# Patient Record
Sex: Female | Born: 1935 | Race: White | Hispanic: No | Marital: Single | Smoking: Never smoker
Health system: Southern US, Community
[De-identification: ages and names within clinical notes are randomized; demographics above are authoritative.]

## PROBLEM LIST (undated history)

## (undated) DIAGNOSIS — C569 Malignant neoplasm of unspecified ovary: Secondary | ICD-10-CM

## (undated) DIAGNOSIS — E785 Hyperlipidemia, unspecified: Secondary | ICD-10-CM

## (undated) DIAGNOSIS — F0390 Unspecified dementia without behavioral disturbance: Secondary | ICD-10-CM

## (undated) DIAGNOSIS — C801 Malignant (primary) neoplasm, unspecified: Secondary | ICD-10-CM

## (undated) DIAGNOSIS — H3530 Unspecified macular degeneration: Secondary | ICD-10-CM

## (undated) DIAGNOSIS — I10 Essential (primary) hypertension: Secondary | ICD-10-CM

---

## 1998-02-04 ENCOUNTER — Other Ambulatory Visit: Admission: RE | Admit: 1998-02-04 | Discharge: 1998-02-04 | Payer: Self-pay | Admitting: Obstetrics and Gynecology

## 1999-02-18 ENCOUNTER — Other Ambulatory Visit: Admission: RE | Admit: 1999-02-18 | Discharge: 1999-02-18 | Payer: Self-pay | Admitting: Obstetrics and Gynecology

## 1999-08-18 ENCOUNTER — Emergency Department (HOSPITAL_COMMUNITY): Admission: EM | Admit: 1999-08-18 | Discharge: 1999-08-18 | Payer: Self-pay | Admitting: *Deleted

## 2000-03-06 ENCOUNTER — Other Ambulatory Visit: Admission: RE | Admit: 2000-03-06 | Discharge: 2000-03-06 | Payer: Self-pay | Admitting: Obstetrics and Gynecology

## 2001-05-16 ENCOUNTER — Other Ambulatory Visit: Admission: RE | Admit: 2001-05-16 | Discharge: 2001-05-16 | Payer: Self-pay | Admitting: Obstetrics and Gynecology

## 2002-06-20 ENCOUNTER — Other Ambulatory Visit: Admission: RE | Admit: 2002-06-20 | Discharge: 2002-06-20 | Payer: Self-pay | Admitting: Obstetrics and Gynecology

## 2002-11-12 ENCOUNTER — Encounter (INDEPENDENT_AMBULATORY_CARE_PROVIDER_SITE_OTHER): Payer: Self-pay | Admitting: Specialist

## 2002-11-12 ENCOUNTER — Ambulatory Visit (HOSPITAL_COMMUNITY): Admission: RE | Admit: 2002-11-12 | Discharge: 2002-11-12 | Payer: Self-pay | Admitting: Gastroenterology

## 2003-07-07 ENCOUNTER — Other Ambulatory Visit: Admission: RE | Admit: 2003-07-07 | Discharge: 2003-07-07 | Payer: Self-pay | Admitting: Obstetrics and Gynecology

## 2004-11-12 ENCOUNTER — Other Ambulatory Visit: Admission: RE | Admit: 2004-11-12 | Discharge: 2004-11-12 | Payer: Self-pay | Admitting: Obstetrics and Gynecology

## 2005-07-11 ENCOUNTER — Other Ambulatory Visit: Admission: RE | Admit: 2005-07-11 | Discharge: 2005-07-11 | Payer: Self-pay | Admitting: Obstetrics and Gynecology

## 2005-12-27 ENCOUNTER — Other Ambulatory Visit: Admission: RE | Admit: 2005-12-27 | Discharge: 2005-12-27 | Payer: Self-pay | Admitting: Family Medicine

## 2007-11-25 ENCOUNTER — Emergency Department (HOSPITAL_COMMUNITY): Admission: EM | Admit: 2007-11-25 | Discharge: 2007-11-25 | Payer: Self-pay | Admitting: Emergency Medicine

## 2009-12-04 ENCOUNTER — Observation Stay (HOSPITAL_COMMUNITY): Admission: EM | Admit: 2009-12-04 | Discharge: 2009-12-05 | Payer: Self-pay | Admitting: Emergency Medicine

## 2010-01-14 ENCOUNTER — Encounter (INDEPENDENT_AMBULATORY_CARE_PROVIDER_SITE_OTHER): Payer: Self-pay | Admitting: *Deleted

## 2010-02-04 ENCOUNTER — Ambulatory Visit: Payer: Self-pay | Admitting: Cardiology

## 2010-02-04 DIAGNOSIS — R079 Chest pain, unspecified: Secondary | ICD-10-CM | POA: Insufficient documentation

## 2010-02-25 ENCOUNTER — Ambulatory Visit: Payer: Self-pay

## 2010-02-25 ENCOUNTER — Encounter: Payer: Self-pay | Admitting: Cardiology

## 2010-02-25 ENCOUNTER — Ambulatory Visit (HOSPITAL_COMMUNITY): Admission: RE | Admit: 2010-02-25 | Discharge: 2010-02-25 | Payer: Self-pay | Admitting: Cardiology

## 2010-02-25 ENCOUNTER — Ambulatory Visit: Payer: Self-pay | Admitting: Internal Medicine

## 2010-11-04 NOTE — Letter (Signed)
Summary: Appointment - Reschedule  [employer], Main Office  [address]   [place], [place] [address]   Phone: [phone]  Fax: [phone]     January 14, 2010 MRN: [id number]   [name] [address] [facility] [address] [place], [place]  [address]   Dear Ms. Brandl,   Due to a change in our office schedule, your appointment on 02/05/2010 at 3:00pm must be changed.  It is very important that we reach you to reschedule this appointment. We look forward to participating in your health care needs. Please contact us at the number listed above at your earliest convenience to reschedule this appointment.     Sincerely,  [facility] Scheduling Team

## 2010-11-04 NOTE — Assessment & Plan Note (Signed)
Summary: nph/chest pain/pt seen in hospital/jml   Primary Provider:  Dr. Foy Guadalajara   History of Present Illness: 77 yo with history of dementia, HTN and hyperlipidemia presents for followup of recent hospitalization for chest pain.  Patient was admitted with aching pain across her upper chest that occurred after breakfast one morning in 3/11.  She had just returned to her room from the dining room.  The pain lasted about 30 minutes and resolved spontaneously.  She went to the ER.  Cardiac enzymes were negative x 3 in the hospital and ECG was unremarkable.  A CXR showed no significant disease.  Since that time, she has had no further chest pain.  She is able to walk around the assisted living without problems, no significant shortness of breath.  No palpitations or lightheadedness.    ECG: NSR, left atrial enlargement  Labs (3/11): LDL 77, HDL 42, cardiac enzymes negative x 3  Current Medications (verified): 1)  I-Vite  Tabs (Multiple Vitamins-Minerals) .... Take One Tablet Once Daily 2)  Lisinopril-Hydrochlorothiazide 10-12.5 Mg Tabs (Lisinopril-Hydrochlorothiazide) .... Take One Tablet Once Daily 3)  Vitamin D 400 Unit Tabs (Cholecalciferol) .... Once Daily 4)  Oscal 500/200 D-3 500-200 Mg-Unit Tabs (Calcium-Vitamin D) .... Once Daily 5)  Aspirin 81 Mg Tabs (Aspirin) .... Once Daily 6)  Namenda 10 Mg Tabs (Memantine Hcl) .... Take One Tablet Two Times A Day 7)  Simvastatin 40 Mg Tabs (Simvastatin) .... Take One Tablet Once Daily  Allergies (verified): No Known Drug Allergies  Past History:  Past Medical History: 1. Dementia 2. HTN 3. Hyperlipidemia 4. TAH 5. Chest pain  Family History: Grandmother with several MIs. Father with possible MI.   Social History: Lives in [facility] assisted living in [place].  Has an adopted daughter and son.  Nonsmoker, no ETOH.   Review of Systems       All systems reviewed and negative except as per HPI.   Vital Signs:  Patient profile:    77 year old female Height:      64 inches Weight:      157 pounds BMI:     27.05 Pulse rate:   77 / minute Pulse rhythm:   regular BP sitting:   116 / 72  (left arm) Cuff size:   regular  Vitals Entered By: Judithe Modest CMA (Feb 04, 2010 11:06 AM)  Physical Exam  General:  Well developed, well nourished, in no acute distress. Head:  normocephalic and atraumatic Nose:  no deformity, discharge, inflammation, or lesions Mouth:  Teeth, gums and palate normal. Oral mucosa normal. Neck:  Neck supple, no JVD. No masses, thyromegaly or abnormal cervical nodes. Lungs:  Clear bilaterally to auscultation and percussion. Heart:  Non-displaced PMI, chest non-tender; regular rate and rhythm, S1, S2 without murmurs, rubs or gallops. Carotid upstroke normal, no bruit.  Trace PT pulse on right, 2+ PT pulse on left.  No edema, no varicosities. Abdomen:  Bowel sounds positive; abdomen soft and non-tender without masses, organomegaly, or hernias noted. No hepatosplenomegaly. Msk:  Back normal, normal gait. Muscle strength and tone normal. Extremities:  No clubbing or cyanosis. Neurologic:  Alert, oriented.  Able to give history.   Skin:  Intact without lesions or rashes. Psych:  Normal affect.   Impression & Recommendations:  Problem # 1:  CHEST PAIN UNSPECIFIED (ICD-786.50) Patient had an episode of chest pain in March of this year after eating breakfast.  No chest pain before that or since.  She seems to have  good exercise tolerance.  ECG is essentially normal.  I think that this would best be managed conservatively.  She is already on a good medical regimen with ASA, statin, and ACEI.  BP is controlled.  Lipids were excellent in the hospital in 3/11.  I will hold off on stress test and will have her get an echocardiogram to make sure that the LV function is preserved. No further workup necessary at this time.   Other Orders: Echocardiogram (Echo)  Patient Instructions: 1)  Your physician has  requested that you have an echocardiogram.  Echocardiography is a painless test that uses sound waves to create images of your heart. It provides your doctor with information about the size and shape of your heart and how well your heart's chambers and valves are working.  This procedure takes approximately one hour. There are no restrictions for this procedure. 2)  Your physician recommends that you schedule a follow-up appointment as needed with Dr Shirlee Latch  if testing is normal.

## 2010-11-04 NOTE — Letter (Signed)
Summary: Dam[facility]   Imported By: Marylou Mccoy 03/25/2010 12:29:05  _____________________________________________________________________  External Attachment:    Type:   Image     Comment:   External Document

## 2010-12-26 LAB — DIFFERENTIAL
Basophils Relative: 1 % (ref 0–1)
Eosinophils Absolute: 0 10*3/uL (ref 0.0–0.7)
Monocytes Relative: 7 % (ref 3–12)
Neutrophils Relative %: 71 % (ref 43–77)

## 2010-12-26 LAB — POCT I-STAT, CHEM 8
BUN: 10 mg/dL (ref 6–23)
Chloride: 105 mEq/L (ref 96–112)
Creatinine, Ser: 0.6 mg/dL (ref 0.4–1.2)
Glucose, Bld: 111 mg/dL — ABNORMAL HIGH (ref 70–99)
Potassium: 3.7 mEq/L (ref 3.5–5.1)

## 2010-12-26 LAB — CBC
Hemoglobin: 12.6 g/dL (ref 12.0–15.0)
MCHC: 34.6 g/dL (ref 30.0–36.0)
MCV: 87.4 fL (ref 78.0–100.0)
RBC: 4.12 MIL/uL (ref 3.87–5.11)
RBC: 4.49 MIL/uL (ref 3.87–5.11)
RDW: 13.5 % (ref 11.5–15.5)

## 2010-12-26 LAB — BASIC METABOLIC PANEL
BUN: 9 mg/dL (ref 6–23)
CO2: 29 mEq/L (ref 19–32)
Calcium: 9.2 mg/dL (ref 8.4–10.5)
Calcium: 9.7 mg/dL (ref 8.4–10.5)
Chloride: 101 mEq/L (ref 96–112)
Creatinine, Ser: 0.72 mg/dL (ref 0.4–1.2)
Creatinine, Ser: 0.74 mg/dL (ref 0.4–1.2)
GFR calc Af Amer: >60 mL/min (ref >60)
GFR calc Af Amer: >60 mL/min (ref >60)
GFR calc non Af Amer: >60 mL/min (ref >60)
GFR calc non Af Amer: >60 mL/min (ref >60)
Glucose, Bld: 120 mg/dL — ABNORMAL HIGH (ref 70–99)
Potassium: 3.7 mEq/L (ref 3.5–5.1)
Sodium: 139 mEq/L (ref 135–145)
Sodium: 140 mEq/L (ref 135–145)

## 2010-12-26 LAB — HEMOGLOBIN A1C
Hgb A1c MFr Bld: 6.5 % — ABNORMAL HIGH (ref 4.6–6.1)
Mean Plasma Glucose: 140 mg/dL

## 2010-12-26 LAB — CK TOTAL AND CKMB (NOT AT ARMC)
CK, MB: 1.1 ng/mL (ref 0.3–4.0)
CK, MB: 1.4 ng/mL (ref 0.3–4.0)
Relative Index: INVALID (ref 0.0–2.5)
Relative Index: INVALID (ref 0.0–2.5)
Total CK: 75 U/L (ref 7–177)

## 2010-12-26 LAB — TROPONIN I: Troponin I: 0.03 ng/mL (ref 0.00–0.06)

## 2010-12-26 LAB — LIPID PANEL
HDL: 42 mg/dL (ref >39)
Total CHOL/HDL Ratio: 3.5 RATIO

## 2010-12-26 LAB — POCT CARDIAC MARKERS: Myoglobin, poc: 72.4 ng/mL (ref 12–200)

## 2011-02-18 NOTE — Op Note (Signed)
   NAME:  [name], [name]                     ACCOUNT NO.:  [id number]   MEDICAL RECORD NO.:  [id number]                   PATIENT TYPE:  AMB   LOCATION:  ENDO                                 FACILITY:  [facility]   PHYSICIAN:  John C. Madilyn Fireman, M.D.                 DATE OF BIRTH:  12-22-1925   DATE OF PROCEDURE:  11/12/2002  DATE OF DISCHARGE:                                 OPERATIVE REPORT   PROCEDURE:  Colonoscopy with polypectomy.   INDICATION FOR PROCEDURE:  History of ovarian cancer in a 77 year old  patient with no prior colon screening.   DESCRIPTION OF PROCEDURE:  The patient was placed in the left lateral  decubitus position and placed on the pulse monitor with continuous low-flow  oxygen delivered by nasal cannula.  She was sedated with 75 mcg IV fentanyl  and 5 mg IV Versed.  The Olympus video colonoscope was inserted into the  rectum and advanced to the cecum, confirmed by transillumination at  McBurney's point and visualization of the ileocecal valve and appendiceal  orifice.  The prep was excellent.  The cecum and ascending colon appeared  normal with no masses, polyps, diverticula, or other mucosal abnormalities.  Within the transverse colon, there was a 6 mm sessile polyp which was  fulgurated by hot biopsy.  The descending colon appeared normal.  The  sigmoid colon revealed a 1.25 cm sessile polyp which was removed by snare  and the base touched up with the hot biopsy forceps.  The remainder of the  sigmoid and rectum appeared normal.  The scope was then withdrawn, and the  patient returned to the recovery room in stable condition.  She tolerated  the procedure well, and there were no immediate complications.   IMPRESSION:  Sigmoid and transverse colon polyps.   PLAN:  Await histology to determine method and interval for future colon  screening.                                               John C. Madilyn Fireman, M.D.    JCH/MEDQ  D:  11/12/2002  T:  11/12/2002  Job:   [id number]   cc:   Molly Maduro L. Foy Guadalajara, M.D.  [address] [place]  [place] [address]  Fax: [phone]

## 2011-04-10 ENCOUNTER — Emergency Department (HOSPITAL_COMMUNITY): Payer: Medicare Other

## 2011-04-10 ENCOUNTER — Observation Stay (HOSPITAL_COMMUNITY)
Admission: EM | Admit: 2011-04-10 | Discharge: 2011-04-11 | Disposition: A | Discharge status: Home / Self Care | Payer: Medicare Other | Attending: Emergency Medicine | Admitting: Emergency Medicine

## 2011-04-10 DIAGNOSIS — Z79899 Other long term (current) drug therapy: Secondary | ICD-10-CM | POA: Insufficient documentation

## 2011-04-10 DIAGNOSIS — E785 Hyperlipidemia, unspecified: Secondary | ICD-10-CM | POA: Insufficient documentation

## 2011-04-10 DIAGNOSIS — I10 Essential (primary) hypertension: Secondary | ICD-10-CM | POA: Insufficient documentation

## 2011-04-10 DIAGNOSIS — R079 Chest pain, unspecified: Principal | ICD-10-CM | POA: Insufficient documentation

## 2011-04-10 LAB — BASIC METABOLIC PANEL
BUN: 12 mg/dL (ref 6–23)
CO2: 26 mEq/L (ref 19–32)
Calcium: 9.9 mg/dL (ref 8.4–10.5)
Creatinine, Ser: 0.69 mg/dL (ref 0.50–1.10)
GFR calc non Af Amer: >60 mL/min (ref >60)
Glucose, Bld: 210 mg/dL — ABNORMAL HIGH (ref 70–99)
Sodium: 137 mEq/L (ref 135–145)

## 2011-04-10 LAB — CBC
Hemoglobin: 12.7 g/dL (ref 12.0–15.0)
MCH: 29.2 pg (ref 26.0–34.0)
MCHC: 34.5 g/dL (ref 30.0–36.0)
Platelets: 224 10*3/uL (ref 150–400)

## 2011-04-10 LAB — DIFFERENTIAL
Basophils Absolute: 0 10*3/uL (ref 0.0–0.1)
Basophils Relative: 0 % (ref 0–1)
Eosinophils Absolute: 0 10*3/uL (ref 0.0–0.7)
Monocytes Absolute: 0.5 10*3/uL (ref 0.1–1.0)
Neutro Abs: 5.1 10*3/uL (ref 1.7–7.7)
Neutrophils Relative %: 72 % (ref 43–77)

## 2011-04-10 LAB — CARDIAC PANEL(CRET KIN+CKTOT+MB+TROPI): CK, MB: 2 ng/mL (ref 0.3–4.0)

## 2011-04-10 LAB — CK TOTAL AND CKMB (NOT AT ARMC)
CK, MB: 2.1 ng/mL (ref 0.3–4.0)
Total CK: 93 U/L (ref 7–177)

## 2011-04-11 LAB — CARDIAC PANEL(CRET KIN+CKTOT+MB+TROPI)
CK, MB: 1.8 ng/mL (ref 0.3–4.0)
Relative Index: INVALID (ref 0.0–2.5)
Total CK: 76 U/L (ref 7–177)
Troponin I: <0.3 ng/mL (ref <0.30)

## 2011-04-11 LAB — LIPID PANEL
HDL: 43 mg/dL (ref >39)
LDL Cholesterol: 57 mg/dL (ref 0–99)

## 2011-04-11 LAB — HEMOGLOBIN A1C
Hgb A1c MFr Bld: 6.7 % — ABNORMAL HIGH (ref <5.7)
Mean Plasma Glucose: 146 mg/dL — ABNORMAL HIGH (ref <117)

## 2011-04-18 NOTE — H&P (Signed)
[name], [name]           ACCOUNT NO.:  [id number]  MEDICAL RECORD NO.:  [id number]  LOCATION:  3713                         FACILITY:  [facility]  PHYSICIAN:  Juliona Vales, DO         DATE OF BIRTH:  13-Jan-1926  DATE OF ADMISSION:  04/10/2011 DATE OF DISCHARGE:                             HISTORY & PHYSICAL   CHIEF COMPLAINT:  Chest pain.  HISTORY OF PRESENT ILLNESS:  The patient is an 77 year old female who lives in an assisted living facility.  The story is somewhat confused as the patient has marked dementia.  Her daughter is present but she was not present during the entire time the patient had her pain and so certain facts are interjected and/or altered by the daughter who has actually been there the entire time.  The patient herself does not have a very good memory.  The patient states that she awoke this morning with a dull which she describes as annoying chest pain under her left breast. It radiated to her left arm and the period of time that it lasted is very vague.  Initially, the patient described the pain that lasted about 30 minutes to an hour and then she says that "oh, it hurts when I was at church too." The pain lasted anywhere from 30 minutes to 3 hours and then the daughter interjects that "you have had this pain for 3 days" so again it is unclear how long the patient has had the pain.  She states that the pain was probably mostly 3/10, occasionally going to a 4/10 to 5/10 although she states there is very little variability in it.  She denies that there is any change in the pain with her position, with her level of exertion, whether or not she ate or not.  The only thing that made a difference is that the daughter gave her two antacids before EMS came and her pain was resolved by the time EMS arrived.  She has had no nausea, vomiting, diaphoresis or shortness of breath associated with it. She states right now she has pain that is about 2.  The patient  had episode of chest pain that was very similar in March 2011.  She was admitted to this facility and ruled out at that time.  According to her daughter, she followed up with Cardiology but the daughter is unaware of the outcome of that consult.  There is an echocardiogram in the computer on this patient dating from May 2011, which demonstrates a normal ejection fraction of 55-60%, trivial pericardial effusion, mild mitral regurg, mildly calcified aortic leaflet, altogether a fairly normal study.  PAST MEDICAL HISTORY:  Significant for: 1. Hypertension. 2. Alzheimer's. 3. Hyperlipidemia. 4. Hypothyroidism.  PAST SURGICAL HISTORY:  Significant for total abdominal hysterectomy and oophorectomy.  MEDICATIONS AT HOME:  Include and we have the names of these, we do not have the dosages, we have the dosages that she was on in March 2011. Simvastatin in March 2011, the patient was taking 40 mg daily, Namenda in March 2011, the patient was taking 10 mg p.o. daily, aspirin in March 2011, the patient was taking 81 mg daily, calcium in March 2011, the patient  was taking 600 mg one p.o. b.i.d., vitamin D3 dosage and frequency unknown, lisinopril/hydrochlorothiazide 10/12.5 one p.o. daily in March 2011, and vitamin E dosage and frequency unknown.  ALLERGIES:  NKDA.  SOCIAL HISTORY:  The patient lives in assisted living.  No tobacco and alcohol.  No recreational drug use.  PHYSICAL EXAMINATION:  VITAL SIGNS:  Temperature 98.2, pulse 88, respiratory rate 19, blood pressure 161/71, O2 sats 98% on 2 liters. GENERAL:  The patient is elderly, weak, and frail.  She has a very poor memory, is an exceedingly poor historian.  Her daughter is present but I feel that her interjections are somewhat confusing and not necessarily helpful and she does not live with the patient, does not actually know when on. EYES:  Pupils equal, round and reactive to light and accommodation. External ocular movements  are bilaterally intact.  Square nonicteric, noninjected.  Mouth:  Oral mucosa dry.  No lesions.  No sores.  Pharynx clear.  No erythema, no exudate. NECK:  Negative for JVD.  Negative for thyromegaly.  Negative for lymphadenopathy. HEART:  Regular rate and rhythm at 80 beats per minute without murmur, ectopy, or gallops.  No lateral PMI.  No thrills. LUNGS:  Clear to auscultation bilaterally without wheezes, rales or rhonchi.  No increased work of breathing.  No tactile fremitus. ABDOMEN:  Soft, nontender, nondistended.  Positive bowel sounds.  No hepatosplenomegaly.  No hernia is palpated. EXTREMITIES:  Negative for cyanosis, clubbing or edema.  The patient has greatly diminished dorsalis pedis and popliteal pulses bilaterally.  No carotid bruits bilaterally. NEUROLOGIC:  Cranial nerves II-XII grossly intact.  Motor and sensory intact.  The patient is moving all extremities.  LABORATORY:  WBC 7.1, hemoglobin 12.2, hematocrit 36.8, platelets 224. Sodium 137, potassium 3.8, chloride 100, CO2 26, BUN 12 creatinine 0.69, glucose 210.  Portable chest x-ray shows no acute changes.  EKG shows normal sinus rhythm.  The only change from EKG from March 2011, the patient's Ts and V1 were downgoing.  They are now upgoing.  CK is 93, MB is 2.1, troponin-I is less than 0.30.  ASSESSMENT: 1. Chest pain.  Differential diagnosis includes GI related gastritis     versus esophagitis versus esophageal spasms. 2. Musculoskeletal. 3. Acute coronary syndrome. 4. Hypertension which is poorly controlled. 5. Hyperlipidemia. 6. Hyperglycemia. 7. Dementia which appears profound.  PLAN: 1. Admit to observation telemetry. 2. Rule out MI __________ criteria. 3. Beta-blocker to help control blood pressure. 4. Protonix. 5. Nitroglycerin. 6. Pain control. 7. Check hemoglobin A1c.          ______________________________ Fran Lowes, DO     AS/MEDQ  D:  04/10/2011  T:  04/11/2011  Job:  [id number]  cc:    Dr. Foy Guadalajara  Electronically Signed by Fran Lowes DO on 04/18/2011 01:40:59 PM

## 2011-04-18 NOTE — Discharge Summary (Signed)
  NAMESHAINDY, [name]           ACCOUNT NO.:  [id number]  MEDICAL RECORD NO.:  [id number]  LOCATION:  3713                         FACILITY:  [facility]  PHYSICIAN:  Daimon Kean, DO         DATE OF BIRTH:  11-12-1925  DATE OF ADMISSION:  04/10/2011 DATE OF DISCHARGE:  04/11/2011                              DISCHARGE SUMMARY   ADMISSION DIAGNOSES:  Chest pain, hypertension, hyperlipidemia, dementia.  HISTORY OF PRESENT ILLNESS:  Please see H and P.  HOSPITAL COURSE:  The patient was admitted to telemetry, she was ruled out for MI by EKG ________ criteria.  Lipid panel was checked, total cholesterol was 138, triglycerides 188, HDL 43, and LDL 59.  The patient's chest pain in most likely GI in origin.  She will be discharged back to her current living situation in good condition.  DISCHARGE INSTRUCTIONS:  ACTIVITY:  As tolerated.  She is to complete all outdoor activities before 10 a.m.  MEDICATIONS AT HOME: 1. Acetaminophen 325 mg 1 p.o. daily. 2. Metoprolol succinate 25 mg 1 p.o. daily. 3. Prilosec 20 mg 1 p.o. daily. 4. Aspirin 81 mg 1 p.o. daily. 5. Lisinopril/hydrochlorothiazide 10/12.5 one p.o. daily. 6. Namenda 10 mg 1 p.o. daily. 7. Ocuvite 1 tablet by mouth daily. 8. Oyster calcium 600 mg 1 p.o. daily. 9. Vitamin D 1 capsule by mouth daily. 10.Zocor 1 p.o. daily at bedtime.  FOLLOWUP:  She is to follow up with Dr. Foy Guadalajara, in 2-4 weeks.  Spent 34 minutes on this discharge.         ______________________________ Fran Lowes, DO    AS/MEDQ  D:  04/11/2011  T:  04/11/2011  Job:  [id number]  cc:   Dr. Foy Guadalajara  Electronically Signed by Fran Lowes DO on 04/18/2011 01:40:55 PM

## 2011-05-04 DIAGNOSIS — R079 Chest pain, unspecified: Secondary | ICD-10-CM

## 2011-06-27 LAB — DIFFERENTIAL
Basophils Absolute: 0
Eosinophils Absolute: 0.1
Eosinophils Relative: 2
Lymphs Abs: 1.4
Neutrophils Relative %: 72

## 2011-06-27 LAB — URINALYSIS, ROUTINE W REFLEX MICROSCOPIC
Hgb urine dipstick: NEGATIVE
Ketones, ur: NEGATIVE
Protein, ur: NEGATIVE
Urobilinogen, UA: 0.2

## 2011-06-27 LAB — I-STAT 8, (EC8 V) (CONVERTED LAB)
Acid-Base Excess: 2
Bicarbonate: 26.7 — ABNORMAL HIGH
Glucose, Bld: 122 — ABNORMAL HIGH
Potassium: 3.9
TCO2: 28
pCO2, Ven: 42.2 — ABNORMAL LOW
pH, Ven: 7.409 — ABNORMAL HIGH

## 2011-06-27 LAB — CBC
MCV: 87.3
Platelets: 207
RDW: 13.4
WBC: 6.6

## 2011-06-27 LAB — POCT I-STAT CREATININE
Creatinine, Ser: 0.8
Operator id: 265201

## 2011-09-13 ENCOUNTER — Other Ambulatory Visit: Payer: Self-pay

## 2011-09-13 ENCOUNTER — Emergency Department (HOSPITAL_COMMUNITY)
Admission: EM | Admit: 2011-09-13 | Discharge: 2011-09-13 | Disposition: A | Discharge status: Home / Self Care | Payer: Medicare Other | Attending: Emergency Medicine | Admitting: Emergency Medicine

## 2011-09-13 ENCOUNTER — Encounter: Payer: Self-pay | Admitting: *Deleted

## 2011-09-13 DIAGNOSIS — I10 Essential (primary) hypertension: Secondary | ICD-10-CM | POA: Insufficient documentation

## 2011-09-13 DIAGNOSIS — R197 Diarrhea, unspecified: Secondary | ICD-10-CM | POA: Insufficient documentation

## 2011-09-13 DIAGNOSIS — E785 Hyperlipidemia, unspecified: Secondary | ICD-10-CM | POA: Insufficient documentation

## 2011-09-13 DIAGNOSIS — R112 Nausea with vomiting, unspecified: Secondary | ICD-10-CM | POA: Insufficient documentation

## 2011-09-13 DIAGNOSIS — F068 Other specified mental disorders due to known physiological condition: Secondary | ICD-10-CM | POA: Insufficient documentation

## 2011-09-13 DIAGNOSIS — A084 Viral intestinal infection, unspecified: Secondary | ICD-10-CM

## 2011-09-13 DIAGNOSIS — R509 Fever, unspecified: Secondary | ICD-10-CM

## 2011-09-13 HISTORY — DX: Unspecified dementia, unspecified severity, without behavioral disturbance, psychotic disturbance, mood disturbance, and anxiety: F03.90

## 2011-09-13 HISTORY — DX: Hyperlipidemia, unspecified: E78.5

## 2011-09-13 HISTORY — DX: Malignant neoplasm of unspecified ovary: C56.9

## 2011-09-13 HISTORY — DX: Unspecified macular degeneration: H35.30

## 2011-09-13 HISTORY — DX: Essential (primary) hypertension: I10

## 2011-09-13 HISTORY — DX: Malignant (primary) neoplasm, unspecified: C80.1

## 2011-09-13 LAB — URINALYSIS, ROUTINE W REFLEX MICROSCOPIC
Bilirubin Urine: NEGATIVE
Glucose, UA: NEGATIVE mg/dL
Nitrite: NEGATIVE
Specific Gravity, Urine: 1.028 (ref 1.005–1.030)
pH: 5 (ref 5.0–8.0)

## 2011-09-13 LAB — URINE MICROSCOPIC-ADD ON

## 2011-09-13 LAB — DIFFERENTIAL
Basophils Relative: 0 % (ref 0–1)
Eosinophils Absolute: 0 10*3/uL (ref 0.0–0.7)
Lymphs Abs: 0.4 10*3/uL — ABNORMAL LOW (ref 0.7–4.0)
Monocytes Absolute: 0.7 10*3/uL (ref 0.1–1.0)
Monocytes Relative: 9 % (ref 3–12)
Neutrophils Relative %: 86 % — ABNORMAL HIGH (ref 43–77)

## 2011-09-13 LAB — CBC
HCT: 38.1 % (ref 36.0–46.0)
Hemoglobin: 12.5 g/dL (ref 12.0–15.0)
MCH: 27.8 pg (ref 26.0–34.0)
MCHC: 32.8 g/dL (ref 30.0–36.0)
RBC: 4.49 MIL/uL (ref 3.87–5.11)

## 2011-09-13 LAB — BASIC METABOLIC PANEL
CO2: 27 mEq/L (ref 19–32)
Calcium: 9.2 mg/dL (ref 8.4–10.5)
Chloride: 95 mEq/L — ABNORMAL LOW (ref 96–112)
Glucose, Bld: 180 mg/dL — ABNORMAL HIGH (ref 70–99)
Sodium: 133 mEq/L — ABNORMAL LOW (ref 135–145)

## 2011-09-13 MED ORDER — FAMOTIDINE 20 MG PO TABS: 20.0000 mg | ORAL_TABLET | Freq: Two times a day (BID) | ORAL | Status: DC | PRN

## 2011-09-13 MED ORDER — ONDANSETRON HCL 4 MG PO TABS: 8.0000 mg | ORAL_TABLET | Freq: Three times a day (TID) | ORAL | Status: AC | PRN

## 2011-09-13 MED ORDER — ONDANSETRON 8 MG PO TBDP
8.0000 mg | ORAL_TABLET | Freq: Once | ORAL | Status: AC
  Administered 2011-09-13: 8 mg via ORAL
  Filled 2011-09-13: qty 1

## 2011-09-13 MED ORDER — ACETAMINOPHEN 325 MG PO TABS
650.0000 mg | ORAL_TABLET | Freq: Once | ORAL | Status: AC
Start: 2011-09-13 — End: 2011-09-13
  Administered 2011-09-13: 650 mg via ORAL
  Filled 2011-09-13: qty 2

## 2011-09-13 NOTE — ED Provider Notes (Addendum)
History     CSN: [id number] Arrival date & time: 09/13/2011  3:04 PM   First MD Initiated Contact with Patient 09/13/11 1509      Chief Complaint  Patient presents with  . Nausea    (Consider location/radiation/quality/duration/timing/severity/associated sxs/prior treatment) HPI Comments: The patient is an 77 year old female with a history of advanced dementia who lives in a nursing home and was sent in by nurses at the nursing facility for evaluation of nausea, vomiting, and diarrhea as well as generalized weakness that began last night. The patient reports that present she feels fine, with no nausea, no further diarrhea or vomiting. She reports that she does not feel weak and reports that all these symptoms were existent only last night and not today. At present she states that she is hungry and would like something to eat. She denies symptoms of headache, visual change, auditory change, nausea, vomiting at present, abdominal pain, chest pain, palpitations, shortness of breath or cough, weakness, or dysuria or further diarrhea.  Patient is a 77 y.o. female presenting with vomiting. The history is provided by the patient, the nursing home and medical records. History Limited By: Dementia.  Emesis  Pertinent negatives include no abdominal pain, no arthralgias, no chills, no cough, no diarrhea, no fever, no headaches and no myalgias.    Past Medical History  Diagnosis Date  . Dementia   . Hypertension   . Macular degeneration   . Hyperlipidemia   . Cancer   . Ovarian cancer     History reviewed. No pertinent past surgical history.  History reviewed. No pertinent family history.  History  Substance Use Topics  . Smoking status: Never Smoker   . Smokeless tobacco: Never Used  . Alcohol Use: No    OB History    Grav Para Term Preterm Abortions TAB SAB Ect Mult Living                  Review of Systems  Constitutional: Negative for fever, chills, appetite change and  fatigue.  HENT: Negative for hearing loss, ear pain, congestion, sore throat, rhinorrhea, drooling, trouble swallowing, neck pain, neck stiffness, voice change and postnasal drip.   Eyes: Negative for photophobia and visual disturbance.  Respiratory: Negative for cough, chest tightness, shortness of breath and wheezing.   Cardiovascular: Negative for chest pain, palpitations and leg swelling.  Gastrointestinal: Positive for vomiting. Negative for nausea, abdominal pain, diarrhea, constipation and abdominal distention.  Genitourinary: Negative.  Negative for dysuria and difficulty urinating.  Musculoskeletal: Negative for myalgias, back pain, joint swelling, arthralgias and gait problem.  Skin: Negative for color change, pallor, rash and wound.  Neurological: Negative for tremors, seizures, syncope, facial asymmetry, speech difficulty, weakness, light-headedness, numbness and headaches.  Hematological: Does not bruise/bleed easily.  Psychiatric/Behavioral: Negative for behavioral problems, confusion and agitation.    Allergies  Review of patient's allergies indicates no known allergies.  Home Medications  No current outpatient prescriptions on file.  BP 158/75  Pulse 108  Temp(Src) 101.7 F (38.7 C) (Oral)  Resp 22  SpO2 95%  Physical Exam  Nursing note and vitals reviewed. Constitutional: She appears well-nourished. No distress.  HENT:  Head: Normocephalic and atraumatic.  Mouth/Throat: Oropharynx is clear and moist. No oropharyngeal exudate.  Eyes: Conjunctivae and EOM are normal. Pupils are equal, round, and reactive to light. Right eye exhibits no discharge. Left eye exhibits no discharge. No scleral icterus.  Neck: Normal range of motion. No JVD present.  Cardiovascular: Normal rate, regular  rhythm, normal heart sounds and intact distal pulses.  Exam reveals no gallop and no friction rub.   No murmur heard. Pulmonary/Chest: Effort normal and breath sounds normal. No  respiratory distress. She has no wheezes. She has no rales. She exhibits no tenderness.  Abdominal: Soft. Bowel sounds are normal. She exhibits no distension. There is no tenderness. There is no rebound and no guarding.  Musculoskeletal: Normal range of motion. She exhibits no edema and no tenderness.  Neurological: She is alert. She has normal reflexes. She displays no tremor. No cranial nerve deficit or sensory deficit. She exhibits normal muscle tone. Coordination normal. GCS eye subscore is 4. GCS verbal subscore is 5. GCS motor subscore is 6.  Reflex Scores:      Bicep reflexes are 2+ on the right side and 2+ on the left side.      Brachioradialis reflexes are 2+ on the right side and 2+ on the left side.      Patellar reflexes are 2+ on the right side and 2+ on the left side.      Normal coordination by finger-nose-finger test, the patient is awake, alert, and oriented to person, place, and events.  Skin: Skin is warm and dry. No rash noted. She is not diaphoretic. No erythema. No pallor.    ED Course  Procedures (including critical care time)  Date: 09/13/2011  Rate: 107  Rhythm: sinus tachycardia and premature ventricular contractions (PVC)  QRS Axis: normal  Intervals: normal  ST/T Wave abnormalities: normal  Conduction Disutrbances:none  Narrative Interpretation: Non-provocative EKG  Old EKG Reviewed: No significant changes  8:13 PM At this time the patient is taking oral fluids without difficulty and with no further vomiting. Her labs are normal. She appears to have a viral infection, possibly viral gastroenteritis or influenza. I will discharge her home to the nursing home with symptomatic treatment.   Labs Reviewed  I-STAT, CHEM 8  URINALYSIS, ROUTINE W REFLEX MICROSCOPIC  CBC  DIFFERENTIAL  TROPONIN I   No results found.   No diagnosis found.    MDM  The differential diagnosis for this patient includes viral gastroenteritis, dehydration (which is not suspected  on physical exam or by symptoms), electrolyte abnormality, arrhythmia, silent myocardial infarction, anemia, urinary tract infection.  At this time as the patient is not currently nauseated or having any further vomiting and states she is hungry, I have ordered her a full liquid diet to assess her ability to tolerate oral intake. I will not start an IV as at this time we have reason to believe that the patient can rehydrate with oral fluids, and her evaluation does not suggest dehydration.        Felisa Bonier, MD 09/13/11 1543  Felisa Bonier, MD 09/13/11 1605  Felisa Bonier, MD 09/13/11 2014

## 2011-09-13 NOTE — ED Notes (Signed)
Per EMS, pt from [facility] in [place], [place], staff called EMS after pt began having N/V/D for 2 hours.

## 2014-11-26 ENCOUNTER — Emergency Department (HOSPITAL_COMMUNITY)
Admission: EM | Admit: 2014-11-26 | Discharge: 2014-11-26 | Disposition: A | Discharge status: Home / Self Care | Payer: Medicare Other | Attending: Emergency Medicine | Admitting: Emergency Medicine

## 2014-11-26 ENCOUNTER — Encounter (HOSPITAL_COMMUNITY): Payer: Self-pay

## 2014-11-26 ENCOUNTER — Emergency Department (HOSPITAL_COMMUNITY): Payer: Medicare Other

## 2014-11-26 DIAGNOSIS — I10 Essential (primary) hypertension: Secondary | ICD-10-CM | POA: Insufficient documentation

## 2014-11-26 DIAGNOSIS — R404 Transient alteration of awareness: Secondary | ICD-10-CM

## 2014-11-26 DIAGNOSIS — F0390 Unspecified dementia without behavioral disturbance: Secondary | ICD-10-CM | POA: Diagnosis not present

## 2014-11-26 DIAGNOSIS — N390 Urinary tract infection, site not specified: Secondary | ICD-10-CM | POA: Diagnosis not present

## 2014-11-26 DIAGNOSIS — Z8543 Personal history of malignant neoplasm of ovary: Secondary | ICD-10-CM | POA: Insufficient documentation

## 2014-11-26 DIAGNOSIS — Z8639 Personal history of other endocrine, nutritional and metabolic disease: Secondary | ICD-10-CM | POA: Insufficient documentation

## 2014-11-26 LAB — CBC WITH DIFFERENTIAL/PLATELET
BASOS ABS: 0 10*3/uL (ref 0.0–0.1)
BASOS PCT: 0 % (ref 0–1)
Eosinophils Absolute: 0 10*3/uL (ref 0.0–0.7)
Eosinophils Relative: 0 % (ref 0–5)
HCT: 33 % — ABNORMAL LOW (ref 36.0–46.0)
HEMOGLOBIN: 10.5 g/dL — AB (ref 12.0–15.0)
Lymphocytes Relative: 14 % (ref 12–46)
Lymphs Abs: 0.7 10*3/uL (ref 0.7–4.0)
MCH: 29.1 pg (ref 26.0–34.0)
MCHC: 31.8 g/dL (ref 30.0–36.0)
MCV: 91.4 fL (ref 78.0–100.0)
MONOS PCT: 5 % (ref 3–12)
Monocytes Absolute: 0.3 10*3/uL (ref 0.1–1.0)
NEUTROS PCT: 82 % — AB (ref 43–77)
Neutro Abs: 4.5 10*3/uL (ref 1.7–7.7)
Platelets: 172 10*3/uL (ref 150–400)
RBC: 3.61 MIL/uL — ABNORMAL LOW (ref 3.87–5.11)
RDW: 15.2 % (ref 11.5–15.5)
WBC: 5.5 10*3/uL (ref 4.0–10.5)

## 2014-11-26 LAB — URINALYSIS, ROUTINE W REFLEX MICROSCOPIC
BILIRUBIN URINE: NEGATIVE
Glucose, UA: NEGATIVE mg/dL
Hgb urine dipstick: NEGATIVE
KETONES UR: NEGATIVE mg/dL
Leukocytes, UA: NEGATIVE
Nitrite: POSITIVE — AB
PROTEIN: NEGATIVE mg/dL
SPECIFIC GRAVITY, URINE: 1.019 (ref 1.005–1.030)
UROBILINOGEN UA: 0.2 mg/dL (ref 0.0–1.0)
pH: 5.5 (ref 5.0–8.0)

## 2014-11-26 LAB — COMPREHENSIVE METABOLIC PANEL
ALK PHOS: 47 U/L (ref 39–117)
ALT: 11 U/L (ref 0–35)
ANION GAP: 9 (ref 5–15)
AST: 15 U/L (ref 0–37)
Albumin: 3.7 g/dL (ref 3.5–5.2)
BILIRUBIN TOTAL: 0.5 mg/dL (ref 0.3–1.2)
BUN: 30 mg/dL — AB (ref 6–23)
CHLORIDE: 103 mmol/L (ref 96–112)
CO2: 25 mmol/L (ref 19–32)
CREATININE: 1.17 mg/dL — AB (ref 0.50–1.10)
Calcium: 8.9 mg/dL (ref 8.4–10.5)
GFR calc Af Amer: 47 mL/min — ABNORMAL LOW (ref >90)
GFR, EST NON AFRICAN AMERICAN: 41 mL/min — AB (ref >90)
Glucose, Bld: 138 mg/dL — ABNORMAL HIGH (ref 70–99)
POTASSIUM: 4.2 mmol/L (ref 3.5–5.1)
Sodium: 137 mmol/L (ref 135–145)
Total Protein: 6.7 g/dL (ref 6.0–8.3)

## 2014-11-26 LAB — RAPID URINE DRUG SCREEN, HOSP PERFORMED
Amphetamines: NOT DETECTED
BENZODIAZEPINES: NOT DETECTED
Barbiturates: NOT DETECTED
Cocaine: NOT DETECTED
OPIATES: NOT DETECTED
Tetrahydrocannabinol: NOT DETECTED

## 2014-11-26 LAB — ETHANOL: Alcohol, Ethyl (B): <5 mg/dL (ref 0–9)

## 2014-11-26 LAB — URINE MICROSCOPIC-ADD ON

## 2014-11-26 LAB — TROPONIN I: Troponin I: <0.03 ng/mL (ref <0.031)

## 2014-11-26 LAB — APTT: aPTT: 31 seconds (ref 24–37)

## 2014-11-26 MED ORDER — CEPHALEXIN 500 MG PO CAPS: 500.0000 mg | ORAL_CAPSULE | Freq: Two times a day (BID) | ORAL | Status: AC

## 2014-11-26 MED ORDER — SODIUM CHLORIDE 0.9 % IV SOLN
100.0000 mL/h | INTRAVENOUS | Status: DC
  Administered 2014-11-26: 100 mL/h via INTRAVENOUS

## 2014-11-26 MED ORDER — SODIUM CHLORIDE 0.9 % IV BOLUS (SEPSIS)
500.0000 mL | Freq: Once | INTRAVENOUS | Status: AC
  Administered 2014-11-26: 500 mL via INTRAVENOUS

## 2014-11-26 MED ORDER — DEXTROSE 5 % IV SOLN
1.0000 g | Freq: Once | INTRAVENOUS | Status: AC
  Administered 2014-11-26: 1 g via INTRAVENOUS
  Filled 2014-11-26: qty 10

## 2014-11-26 NOTE — ED Notes (Signed)
Patient from [facility] in [place]. At baseline patient is alert and ambulatory able to speak clearly with occasional stutter but disoriented x4. Family states facility called states patient had difficulty walking and had fine tremors of hands and lips. Patient per family has increased slurred speech from baseline and an incontinence episode- patient able to go to restroom on her own. Patient alert- did state name initial for this RN but unable to recollect at present time. Patient denies headache, numbness or tingling or weakness at present time. Patient unable to keep bilateral legs up in the air patient- unable to ascertain if due to weakness or pt unable to understand command

## 2014-11-26 NOTE — ED Provider Notes (Signed)
CSN: [id number]     Arrival date & time 11/26/14  0912 History   First MD Initiated Contact with Patient 11/26/14 0913     Chief Complaint  Patient presents with  . Altered Mental Status     (Consider location/radiation/quality/duration/timing/severity/associated sxs/prior Treatment) HPI Patient presents from assisted living facility with staff and family concerns of increased or disorientation, possible left-sided weakness. Last seen normal time was at least 12 hours ago, before the patient went to sleep. The patient has dementia, level V caveat. Patient denies any current plans, including any weakness or pain. Per report the patient was seen to have left arm drift, decreased interactivity this morning by nursing home staff. EMS was called for sick call. EMS reports that en route the patient seemed to improve, had no complaints, remained hemodynamically consistent.  Past Medical History  Diagnosis Date  . Dementia   . Hypertension   . Ovarian cancer   . Macular degeneration   . Hyperlipidemia    History reviewed. No pertinent past surgical history. History reviewed. No pertinent family history. History  Substance Use Topics  . Smoking status: Never Smoker   . Smokeless tobacco: Not on file  . Alcohol Use: No   OB History    No data available     Review of Systems  Unable to perform ROS: Dementia      Allergies  Review of patient's allergies indicates no known allergies.  Home Medications   Prior to Admission medications   Not on File   BP 110/41 mmHg  Pulse 82  Temp(Src) 98.5 F (36.9 C) (Oral)  Resp 16  SpO2 98% Physical Exam  Constitutional: She appears well-developed and well-nourished. She has a sickly appearance. No distress.  Elderly female sitting upright in bed using her arm freely to adjust her clothing, scratch her face.   HENT:  Head: Normocephalic and atraumatic.  Eyes: Conjunctivae and EOM are normal.  Cardiovascular: Normal rate and  regular rhythm.   Pulmonary/Chest: Effort normal and breath sounds normal. No stridor. No respiratory distress.  Abdominal: She exhibits no distension.  Musculoskeletal: She exhibits no edema.  Neurological: She is alert. She displays atrophy. She displays no tremor. No cranial nerve deficit. She exhibits normal muscle tone. She displays no seizure activity.  No facial asymmetry, no asymmetry of strength, speech is clear. Patient is oriented only to self.  Skin: Skin is warm and dry.  Psychiatric: She has a normal mood and affect. Cognition and memory are impaired.  Nursing note and vitals reviewed.   ED Course  Procedures (including critical care time) Labs Review Labs Reviewed  CBC WITH DIFFERENTIAL/PLATELET - Abnormal; Notable for the following:    RBC 3.61 (*)    Hemoglobin 10.5 (*)    HCT 33.0 (*)    Neutrophils Relative % 82 (*)    All other components within normal limits  COMPREHENSIVE METABOLIC PANEL - Abnormal; Notable for the following:    Glucose, Bld 138 (*)    BUN 30 (*)    Creatinine, Ser 1.17 (*)    GFR calc non Af Amer 41 (*)    GFR calc Af Amer 47 (*)    All other components within normal limits  URINALYSIS, ROUTINE W REFLEX MICROSCOPIC - Abnormal; Notable for the following:    Nitrite POSITIVE (*)    All other components within normal limits  URINE MICROSCOPIC-ADD ON - Abnormal; Notable for the following:    Bacteria, UA MANY (*)    All other  components within normal limits  APTT  ETHANOL  TROPONIN I  URINE RAPID DRUG SCREEN (HOSP PERFORMED)    Imaging Review Ct Head Wo Contrast  11/26/2014   CLINICAL DATA:  81 year old female with left side weakness, left arm drift. Initial encounter.  EXAM: CT HEAD WITHOUT CONTRAST  TECHNIQUE: Contiguous axial images were obtained from the base of the skull through the vertex without intravenous contrast.  COMPARISON:  None.  FINDINGS: Trace paranasal sinus mucosal thickening. Mastoids and tympanic cavities are clear.  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 No acute osseous abnormality identified.  Visualized orbit soft tissues are within normal limits. Partially calcified scalp vertex sebaceous cysts.  Calcified atherosclerosis at the skull base. Cerebral volume is within normal limits for age. No ventriculomegaly. Patchy and confluent cerebral white matter hypodensity. No suspicious intracranial vascular hyperdensity. No evidence of cortically based acute infarction identified. No acute intracranial hemorrhage identified.  IMPRESSION: 1. No acute cortically based infarct or acute intracranial abnormality identified. 2. Nonspecific cerebral white matter changes most commonly due to chronic small vessel disease.   Electronically Signed   By: Genevie Ann M.D.   On: 11/26/2014 09:52   Dg Chest Port 1 View  11/26/2014   CLINICAL DATA:  Altered mental status  EXAM: PORTABLE CHEST - 1 VIEW  COMPARISON:  None.  FINDINGS: The lungs are clear. Heart is upper normal in size with pulmonary vascularity within normal limits. No adenopathy. No bone lesions.  IMPRESSION: No edema or consolidation.   Electronically Signed   By: Lowella Grip III M.D.   On: 11/26/2014 14:02     EKG Interpretation   Date/Time:  Wednesday November 26 2014 09:24:33 EST Ventricular Rate:  82 PR Interval:  123 QRS Duration: 83 QT Interval:  371 QTC Calculation: 433 R Axis:   55 Text Interpretation:  Sinus rhythm Abnormal R-wave progression, early  transition Minimal ST depression, inferior leads Baseline wander in  lead(s) II III aVF Sinus rhythm Artifact Non-specific intra-ventricular  conduction delay Abnormal ekg Confirmed by Carmin Muskrat  MD ([id number]) on  11/26/2014 9:29:54 AM     Pulse ox 99% room air normal Cardiac 80 sinus rhythm normal  12:52 PM Patient is asleep.  I discussed all findings with the patient's family members. She is initiating antibiotic therapy for urinary tract infection.  We discussed  stroke prophylaxis, and family agrees that aspirin is a desired amount of pharmaceutical control.  2:53 PM Patient has been ambulatory, eating, is in no distress. BP remains slightly low- additional fluid provided.  3:19 PM Patient sitting upright, in no distress.  I again discussed all findings with the patient's daughter. Patient feels better, is requesting food.  MDM   Patient presents after an episode of altered mental status. Patient has dementia, and this, locates the history taking. Here the patient is close to baseline initially, returns entirely to baseline over the course of fluid resuscitation, monitoring. No evidence for acute stroke. Patient is using aspirin as stroke prophylaxis, and after lengthy conversation with the patient's family, this seems like a tolerable level of anticoagulant. Patient does have evidence for urinary tract infection, which she received treatment for here, the IV antibiotics. Patient was discharged in stable condition to follow-up with her primary care physician via telephone tomorrow to ensure appropriate resolution, and continued management.    Carmin Muskrat, MD 11/26/14 [id number]

## 2014-11-26 NOTE — ED Notes (Signed)
Portable at bedside 

## 2014-11-26 NOTE — ED Notes (Signed)
Dr. Lockwood at the bedside. 

## 2014-11-26 NOTE — ED Notes (Addendum)
Per [facility], pt from [facility] for sick call. They arrived and she had left sided weakness and left arm drift. CBG was 151, afib on monitor per EMS rates between 120-140. Pt has no documented hx of afib. Pt does have hx of dementia. LKW was sometime yesterday. Am shift saw her this morning at 0615. 20g to [facility]. Pt was very drowsy and became more alert in the truck. Pt is moving her left arm freely at this time.

## 2014-11-26 NOTE — ED Notes (Signed)
Dr. Vanita Panda at bedside updating pt and family on results and plan of care.

## 2014-11-26 NOTE — Discharge Instructions (Signed)
As discussed, it is important that you call tomorrow to discuss your mother's condition with her physician.  Please be sure to discuss today's evaluation for her episode of change in cognition, as well as the finding of a urinary tract infection.  Equally important is that she confirm her medications, specifically using aspirin as a preventive measure for strokes.  Return here for concerning changes in her condition.

## 2014-11-26 NOTE — ED Notes (Signed)
Patient ambulated in hallways without difficulty or swaying or weakness. After ambulating patient becoming agitated and wanting to pace family assisting patient in chair and RN brought food.

## 2014-12-18 ENCOUNTER — Encounter (HOSPITAL_COMMUNITY): Payer: Self-pay | Admitting: Emergency Medicine

## 2014-12-18 ENCOUNTER — Emergency Department (HOSPITAL_COMMUNITY): Payer: Medicare Other

## 2014-12-18 ENCOUNTER — Emergency Department (HOSPITAL_COMMUNITY)
Admission: EM | Admit: 2014-12-18 | Discharge: 2014-12-18 | Disposition: A | Discharge status: Home / Self Care | Payer: Medicare Other | Attending: Emergency Medicine | Admitting: Emergency Medicine

## 2014-12-18 DIAGNOSIS — I10 Essential (primary) hypertension: Secondary | ICD-10-CM | POA: Diagnosis not present

## 2014-12-18 DIAGNOSIS — Y9289 Other specified places as the place of occurrence of the external cause: Secondary | ICD-10-CM | POA: Insufficient documentation

## 2014-12-18 DIAGNOSIS — W050XXA Fall from non-moving wheelchair, initial encounter: Secondary | ICD-10-CM | POA: Diagnosis not present

## 2014-12-18 DIAGNOSIS — Z7982 Long term (current) use of aspirin: Secondary | ICD-10-CM | POA: Diagnosis not present

## 2014-12-18 DIAGNOSIS — Z8669 Personal history of other diseases of the nervous system and sense organs: Secondary | ICD-10-CM | POA: Insufficient documentation

## 2014-12-18 DIAGNOSIS — E785 Hyperlipidemia, unspecified: Secondary | ICD-10-CM | POA: Insufficient documentation

## 2014-12-18 DIAGNOSIS — Z8543 Personal history of malignant neoplasm of ovary: Secondary | ICD-10-CM | POA: Diagnosis not present

## 2014-12-18 DIAGNOSIS — Y998 Other external cause status: Secondary | ICD-10-CM | POA: Diagnosis not present

## 2014-12-18 DIAGNOSIS — Z79899 Other long term (current) drug therapy: Secondary | ICD-10-CM | POA: Diagnosis not present

## 2014-12-18 DIAGNOSIS — E860 Dehydration: Secondary | ICD-10-CM

## 2014-12-18 DIAGNOSIS — Y9389 Activity, other specified: Secondary | ICD-10-CM | POA: Diagnosis not present

## 2014-12-18 DIAGNOSIS — W19XXXA Unspecified fall, initial encounter: Secondary | ICD-10-CM

## 2014-12-18 DIAGNOSIS — S0012XA Contusion of left eyelid and periocular area, initial encounter: Secondary | ICD-10-CM | POA: Diagnosis not present

## 2014-12-18 DIAGNOSIS — S0990XA Unspecified injury of head, initial encounter: Secondary | ICD-10-CM | POA: Diagnosis not present

## 2014-12-18 DIAGNOSIS — F0390 Unspecified dementia without behavioral disturbance: Secondary | ICD-10-CM | POA: Diagnosis not present

## 2014-12-18 LAB — URINALYSIS, ROUTINE W REFLEX MICROSCOPIC
Bilirubin Urine: NEGATIVE
Glucose, UA: NEGATIVE mg/dL
Hgb urine dipstick: NEGATIVE
Ketones, ur: 15 mg/dL — AB
LEUKOCYTES UA: NEGATIVE
Nitrite: NEGATIVE
Protein, ur: NEGATIVE mg/dL
Specific Gravity, Urine: 1.028 (ref 1.005–1.030)
Urobilinogen, UA: 0.2 mg/dL (ref 0.0–1.0)
pH: 5 (ref 5.0–8.0)

## 2014-12-18 LAB — I-STAT CHEM 8, ED
BUN: 44 mg/dL — ABNORMAL HIGH (ref 6–23)
Calcium, Ion: 1.16 mmol/L (ref 1.13–1.30)
Chloride: 103 mmol/L (ref 96–112)
Creatinine, Ser: 1.1 mg/dL (ref 0.50–1.10)
Glucose, Bld: 124 mg/dL — ABNORMAL HIGH (ref 70–99)
HCT: 35 % — ABNORMAL LOW (ref 36.0–46.0)
Hemoglobin: 11.9 g/dL — ABNORMAL LOW (ref 12.0–15.0)
Potassium: 4.1 mmol/L (ref 3.5–5.1)
Sodium: 140 mmol/L (ref 135–145)
TCO2: 22 mmol/L (ref 0–100)

## 2014-12-18 NOTE — ED Provider Notes (Signed)
CSN: [id number]     Arrival date & time 12/18/14  1438 History   First MD Initiated Contact with Patient 12/18/14 1455     Chief Complaint  Patient presents with  . Fall     (Consider location/radiation/quality/duration/timing/severity/associated sxs/prior Treatment) HPI   [name] is a 81 y.o. female who presents for evaluation of fall. She cannot recall what happened. She came by EMS. She has a contusion of her forehead. She was evaluated here about 3 weeks ago with an episode of confusion. Today, she apparently fell out of her wheelchair, an unwitnessed fall. There has been no reported loss of consciousness or change in behavior.  Level V Caveat- dementia   Past Medical History  Diagnosis Date  . Dementia   . Hypertension   . Ovarian cancer   . Macular degeneration   . Hyperlipidemia    History reviewed. No pertinent past surgical history. History reviewed. No pertinent family history. History  Substance Use Topics  . Smoking status: Never Smoker   . Smokeless tobacco: Not on file  . Alcohol Use: No   OB History    No data available     Review of Systems  Unable to perform ROS     Allergies  Review of patient's allergies indicates no known allergies.  Home Medications   Prior to Admission medications   Medication Sig Start Date End Date Taking? Authorizing Provider  acetaminophen (TYLENOL) 500 MG tablet Take 1,000 mg by mouth every 8 (eight) hours.    Yes Historical Provider, MD  aspirin 81 MG tablet Take 81 mg by mouth daily.   Yes Historical Provider, MD  calcium-vitamin D (OSCAL WITH D) 500-200 MG-UNIT per tablet Take 1 tablet by mouth daily with breakfast.   Yes Historical Provider, MD  Cholecalciferol (VITAMIN D3) 5000 UNITS CAPS Take 1 tablet by mouth daily.   Yes Historical Provider, MD  guaiFENesin (ROBITUSSIN) 100 MG/5ML liquid Take 200 mg by mouth 3 (three) times daily as needed for cough.   Yes Historical Provider, MD   lisinopril-hydrochlorothiazide (PRINZIDE,ZESTORETIC) 10-12.5 MG per tablet Take 1 tablet by mouth daily.   Yes Historical Provider, MD  memantine (NAMENDA XR) 28 MG CP24 24 hr capsule Take 28 mg by mouth daily.    Yes Historical Provider, MD  metoprolol succinate (TOPROL-XL) 25 MG 24 hr tablet Take 25 mg by mouth daily.   Yes Historical Provider, MD  Multiple Vitamins-Minerals (I-VITE PO) Take 1 tablet by mouth at bedtime.    Yes Historical Provider, MD  simvastatin (ZOCOR) 40 MG tablet Take 40 mg by mouth daily at 6 PM.    Yes Historical Provider, MD  vitamin B-12 (CYANOCOBALAMIN) 1000 MCG tablet Take 1,000 mcg by mouth daily.   Yes Historical Provider, MD   BP 108/95 mmHg  Pulse 70  Resp 18  SpO2 98% Physical Exam  Constitutional: She appears well-developed. No distress.  HENT:  Head: Normocephalic.  Right Ear: External ear normal.  Left Ear: External ear normal.  Small contusion, left, for having left lateral periorbital region. No crepitation.  Eyes: Conjunctivae and EOM are normal. Pupils are equal, round, and reactive to light.  Neck: Normal range of motion and phonation normal. Neck supple.  Cardiovascular: Normal rate, regular rhythm and normal heart sounds.   Pulmonary/Chest: Effort normal and breath sounds normal. She exhibits no bony tenderness.  Abdominal: Soft. There is no tenderness.  Musculoskeletal: Normal range of motion. She exhibits no edema or tenderness.  No large joint deformities  Neurological: She is alert. No cranial nerve deficit or sensory deficit. She exhibits normal muscle tone. Coordination normal.  She accurately follows commands during physical examination testing  Skin: Skin is warm, dry and intact.  Psychiatric: She has a normal mood and affect. Her behavior is normal.  Nursing note and vitals reviewed.   ED Course  Procedures (including critical care time)  Medications - No data to display  Patient Vitals for the past 24 hrs:  BP Pulse Resp  SpO2  12/18/14 1724 - 70 - 98 %  12/18/14 1715 108/95 mmHg - - 99 %  12/18/14 1700 116/62 mmHg 92 - (!) 79 %  12/18/14 1630 117/96 mmHg 64 - 100 %  12/18/14 1530 (!) 106/48 mmHg 71 - 99 %  12/18/14 1515 (!) 115/52 mmHg (!) 54 - 100 %  12/18/14 1503 116/72 mmHg 74 18 100 %  12/18/14 1446 (!) 115/43 mmHg 64 - 100 %  12/18/14 1438 - - - 99 %       Labs Review Labs Reviewed  URINALYSIS, ROUTINE W REFLEX MICROSCOPIC - Abnormal; Notable for the following:    Ketones, ur 15 (*)    All other components within normal limits  I-STAT CHEM 8, ED - Abnormal; Notable for the following:    BUN 44 (*)    Glucose, Bld 124 (*)    Hemoglobin 11.9 (*)    HCT 35.0 (*)    All other components within normal limits  URINE CULTURE    Imaging Review Ct Head Wo Contrast  12/18/2014   CLINICAL DATA:  Status post fall. Patient is alert. Unsure of loss of consciousness.  EXAM: CT HEAD WITHOUT CONTRAST  TECHNIQUE: Contiguous axial images were obtained from the base of the skull through the vertex without intravenous contrast.  COMPARISON:  11/26/2014  FINDINGS: There is no evidence of mass effect, midline shift, or extra-axial fluid collections. There is no evidence of a space-occupying lesion or intracranial hemorrhage. There is no evidence of a cortical-based area of acute infarction. There is generalized cerebral atrophy. There is periventricular white matter low attenuation likely secondary to microangiopathy.  The ventricles and sulci are appropriate for the patient's age. The basal cisterns are patent.  Visualized portions of the orbits are unremarkable. The visualized portions of the paranasal sinuses and mastoid air cells are unremarkable. Cerebrovascular atherosclerotic calcifications are noted.  The osseous structures are unremarkable. Multiple scalp sebaceous cysts.  IMPRESSION: 1. No acute intracranial pathology. 2. Chronic microvascular disease and cerebral atrophy.   Electronically Signed   By: Kathreen Devoid   On: 12/18/2014 16:15     EKG Interpretation None      MDM   Final diagnoses:  Fall, initial encounter  Head injury, initial encounter  Dehydration    Fall without apparent inciting cause, other than mild dehydration.   Nursing Notes Reviewed/ Care Coordinated Applicable Imaging Reviewed Interpretation of Laboratory Data incorporated into ED treatment  The patient appears reasonably screened and/or stabilized for discharge and I doubt any other medical condition or other [facility] requiring further screening, evaluation, or treatment in the ED at this time prior to discharge.  Plan: Home Medications- usual; Home Treatments- increase oral fluids; return here if the recommended treatment, does not improve the symptoms; Recommended follow up- PCP 1 week     Daleen Bo, MD 12/19/14 1331

## 2014-12-18 NOTE — ED Notes (Signed)
Gregary Signs, RN assisted me with in and out cath; pt tolerated well; placed a clean chuk underneath pt

## 2014-12-18 NOTE — ED Notes (Signed)
Per EMS pt fell out of wheelchair and hit head. Fall was not observed. Pt noted to have swelling and bruising to L eye. Pt is alert has a hx of dementia. Pt sts she didn't know she fell and has no pain. Unknown if pt had a LOC. EMS Vitals: 108/56, 73 Hr, 22RR, 99% Rm Air, 152 CBG. Pt has hx of HTN.

## 2014-12-18 NOTE — ED Notes (Signed)
Per EMS, unwitnessed fall from chair in assisted living.

## 2014-12-18 NOTE — ED Notes (Signed)
Pt physically placed in room 9 from the hallway; myself and Gregary Signs, RN undressed pt, placed in gown, on continuous pulse oximetry and blood pressure cuff

## 2014-12-18 NOTE — Discharge Instructions (Signed)
Use ice on the sore area of your head, 2 or 3 times a day for 2 days. Make an extra glass or 2 water each day. Try to eat and drink regularly.   Dehydration, Adult Dehydration means your body does not have as much fluid as it needs. Your kidneys, brain, and heart will not work properly without the right amount of fluids and salt.  HOME CARE  Ask your doctor how to replace body fluid losses (rehydrate).  Drink enough fluids to keep your pee (urine) clear or pale yellow.  Drink small amounts of fluids often if you feel sick to your stomach (nauseous) or throw up (vomit).  Eat like you normally do.  Avoid:  Foods or drinks high in sugar.  Bubbly (carbonated) drinks.  Juice.  Very hot or cold fluids.  Drinks with caffeine.  Fatty, greasy foods.  Alcohol.  Tobacco.  Eating too much.  Gelatin desserts.  Wash your hands to avoid spreading germs (bacteria, viruses).  Only take medicine as told by your doctor.  Keep all doctor visits as told. GET HELP RIGHT AWAY IF:   You cannot drink something without throwing up.  You get worse even with treatment.  Your vomit has blood in it or looks greenish.  Your poop (stool) has blood in it or looks black and tarry.  You have not peed in 6 to 8 hours.  You pee a small amount of very dark pee.  You have a fever.  You pass out (faint).  You have belly (abdominal) pain that gets worse or stays in one spot (localizes).  You have a rash, stiff neck, or bad headache.  You get easily annoyed, sleepy, or are hard to wake up.  You feel weak, dizzy, or very thirsty. MAKE SURE YOU:   Understand these instructions.  Will watch your condition.  Will get help right away if you are not doing well or get worse. Document Released: 07/16/2009 Document Revised: 12/12/2011 Document Reviewed: 05/09/2011 [facility] Patient Information 2015 [place], [place]. This information is not intended to replace advice given to you by your  health care provider. Make sure you discuss any questions you have with your health care provider.  Head Injury You have a head injury. Headaches and throwing up (vomiting) are common after a head injury. It should be easy to wake up from sleeping. Sometimes you must stay in the hospital. Most problems happen within the first 24 hours. Side effects may occur up to 7-10 days after the injury.  WHAT ARE THE TYPES OF HEAD INJURIES? Head injuries can be as minor as a bump. Some head injuries can be more severe. More severe head injuries include:  A jarring injury to the brain (concussion).  A bruise of the brain (contusion). This mean there is bleeding in the brain that can cause swelling.  A cracked skull (skull fracture).  Bleeding in the brain that collects, clots, and forms a bump (hematoma). WHEN SHOULD I GET HELP RIGHT AWAY?   You are confused or sleepy.  You cannot be woken up.  You feel sick to your stomach (nauseous) or keep throwing up (vomiting).  Your dizziness or unsteadiness is getting worse.  You have very bad, lasting headaches that are not helped by medicine. Take medicines only as told by your doctor.  You cannot use your arms or legs like normal.  You cannot walk.  You notice changes in the black spots in the center of the colored part of your eye (  pupil).  You have clear or bloody fluid coming from your nose or ears.  You have trouble seeing. During the next 24 hours after the injury, you must stay with someone who can watch you. This person should get help right away (call 911 in the [place] if you start to shake and are not able to control it (have seizures), you pass out, or you are unable to wake up. HOW CAN I PREVENT A HEAD INJURY IN THE FUTURE?  Wear seat belts.  Wear a helmet while bike riding and playing sports like football.  Stay away from dangerous activities around the house. WHEN CAN I RETURN TO NORMAL ACTIVITIES AND ATHLETICS? See your doctor  before doing these activities. You should not do normal activities or play contact sports until 1 week after the following symptoms have stopped:  Headache that does not go away.  Dizziness.  Poor attention.  Confusion.  Memory problems.  Sickness to your stomach or throwing up.  Tiredness.  Fussiness.  Bothered by bright lights or loud noises.  Anxiousness or depression.  Restless sleep. MAKE SURE YOU:   Understand these instructions.  Will watch your condition.  Will get help right away if you are not doing well or get worse. Document Released: 09/01/2008 Document Revised: 02/03/2014 Document Reviewed: 05/27/2013 [facility] Patient Information 2015 [place], [place]. This information is not intended to replace advice given to you by your health care provider. Make sure you discuss any questions you have with your health care provider.

## 2014-12-20 LAB — URINE CULTURE
Colony Count: NO GROWTH
Culture: NO GROWTH
Special Requests: NORMAL

## 2015-02-28 ENCOUNTER — Encounter (HOSPITAL_COMMUNITY): Payer: Self-pay | Admitting: Emergency Medicine

## 2015-02-28 ENCOUNTER — Emergency Department (HOSPITAL_COMMUNITY)
Admission: EM | Admit: 2015-02-28 | Discharge: 2015-02-28 | Disposition: A | Discharge status: Home / Self Care | Payer: Medicare Other | Attending: Emergency Medicine | Admitting: Emergency Medicine

## 2015-02-28 DIAGNOSIS — Z8669 Personal history of other diseases of the nervous system and sense organs: Secondary | ICD-10-CM | POA: Insufficient documentation

## 2015-02-28 DIAGNOSIS — Z8543 Personal history of malignant neoplasm of ovary: Secondary | ICD-10-CM | POA: Insufficient documentation

## 2015-02-28 DIAGNOSIS — F0390 Unspecified dementia without behavioral disturbance: Secondary | ICD-10-CM | POA: Diagnosis not present

## 2015-02-28 DIAGNOSIS — Y998 Other external cause status: Secondary | ICD-10-CM | POA: Insufficient documentation

## 2015-02-28 DIAGNOSIS — Y9389 Activity, other specified: Secondary | ICD-10-CM | POA: Insufficient documentation

## 2015-02-28 DIAGNOSIS — S0990XA Unspecified injury of head, initial encounter: Secondary | ICD-10-CM | POA: Diagnosis not present

## 2015-02-28 DIAGNOSIS — W01198A Fall on same level from slipping, tripping and stumbling with subsequent striking against other object, initial encounter: Secondary | ICD-10-CM | POA: Insufficient documentation

## 2015-02-28 DIAGNOSIS — I10 Essential (primary) hypertension: Secondary | ICD-10-CM | POA: Insufficient documentation

## 2015-02-28 DIAGNOSIS — Z79899 Other long term (current) drug therapy: Secondary | ICD-10-CM | POA: Diagnosis not present

## 2015-02-28 DIAGNOSIS — Y9289 Other specified places as the place of occurrence of the external cause: Secondary | ICD-10-CM | POA: Insufficient documentation

## 2015-02-28 NOTE — ED Notes (Signed)
Bed: WA04 Expected date: 02/28/15 Expected time: 6:55 PM Means of arrival: Ambulance Comments: Fall, hit head, no blood thinners

## 2015-02-28 NOTE — Discharge Instructions (Signed)

## 2015-02-28 NOTE — ED Notes (Addendum)
Per EMS pt from [place] for complaint of fall; standing from chair; tripped from chair resulting in hit to wall; hematoma noted to left temporal/posterior head; no LOC or on blood thinners. Pt denies complaint at present time. Pt disoriented x4 per normal.

## 2015-02-28 NOTE — ED Notes (Signed)
Knapp at bedside; per verbal discontinue CT head WO contrast.

## 2015-02-28 NOTE — ED Provider Notes (Signed)
CSN: [id number]     Arrival date & time 02/28/15  1903 History   First MD Initiated Contact with Patient 02/28/15 1905     Chief Complaint  Patient presents with  . Fall  . Head Injury   HPI Patient presents to the emergency room with complaints of a minor head injury. Patient is a nursing facility. The patient was getting up from a chair when she tripped and fell against a wall with her head. Staff at the facility thought she had a hematoma and sent her to the emergency room for evaluation. Patient did not have any loss of consciousness. She does not take any blood thinners. She does have a history of dementia and is not able to tell me what exactly happened. She denies any specific complaints. The patient denies neck pain chest pain abdominal pain. Patient's daughter is here with her now. She states that her mother is acting normally for her. Past Medical History  Diagnosis Date  . Dementia   . Hypertension   . Ovarian cancer   . Macular degeneration   . Hyperlipidemia    History reviewed. No pertinent past surgical history. No family history on file. History  Substance Use Topics  . Smoking status: Never Smoker   . Smokeless tobacco: Not on file  . Alcohol Use: No   OB History    No data available     Review of Systems  All other systems reviewed and are negative.     Allergies  Review of patient's allergies indicates no known allergies.  Home Medications   Prior to Admission medications   Medication Sig Start Date End Date Taking? Authorizing Provider  acetaminophen (TYLENOL) 500 MG tablet Take 1,000 mg by mouth every 8 (eight) hours.    Yes Historical Provider, MD  Cholecalciferol (VITAMIN D3) 5000 UNITS CAPS Take 1 tablet by mouth daily.   Yes Historical Provider, MD  ENSURE (ENSURE) Take 237 mLs by mouth daily.   Yes Historical Provider, MD  guaiFENesin (ROBITUSSIN) 100 MG/5ML liquid Take 200 mg by mouth 3 (three) times daily as needed for cough.   Yes Historical  Provider, MD  metoprolol succinate (TOPROL-XL) 25 MG 24 hr tablet Take 25 mg by mouth daily.   Yes Historical Provider, MD  Multiple Vitamins-Minerals (I-VITE PO) Take 1 tablet by mouth at bedtime.    Yes Historical Provider, MD  Oyster Shell (OYSCO 500 PO) Take 1 tablet by mouth daily.   Yes Historical Provider, MD  vitamin B-12 (CYANOCOBALAMIN) 1000 MCG tablet Take 1,000 mcg by mouth daily.   Yes Historical Provider, MD   BP 164/74 mmHg  Pulse 74  Temp(Src) 97.9 F (36.6 C) (Oral)  Resp 18  SpO2 99% Physical Exam  Constitutional: She appears well-developed and well-nourished. No distress.  HENT:  Head: Normocephalic.  Right Ear: External ear normal.  Left Ear: External ear normal.  No hematoma palpated, patient does have a few firm cysts in a couple of locations underneath the surface of the scalp,  Eyes: Conjunctivae are normal. Right eye exhibits no discharge. Left eye exhibits no discharge. No scleral icterus.  Neck: Neck supple. No tracheal deviation present.  Cardiovascular: Normal rate, regular rhythm and intact distal pulses.   Pulmonary/Chest: Effort normal and breath sounds normal. No stridor. No respiratory distress. She has no wheezes. She has no rales.  Abdominal: Soft. Bowel sounds are normal. She exhibits no distension. There is no tenderness. There is no rebound and no guarding.  Musculoskeletal: She  exhibits no edema or tenderness.       Cervical back: Normal.       Thoracic back: Normal.       Lumbar back: Normal.  Neurological: She is alert. She has normal strength. No cranial nerve deficit (no facial droop, extraocular movements intact, no slurred speech) or sensory deficit. She exhibits normal muscle tone. She displays no seizure activity. Coordination normal. GCS eye subscore is 4. GCS verbal subscore is 4. GCS motor subscore is 6.  Patient is confused, at times her speech is nonsensical, patient's able to walk without difficulty, she smiles and laughs  appropriately  Skin: Skin is warm and dry. No rash noted.  Psychiatric: She has a normal mood and affect.  Nursing note and vitals reviewed.   ED Course  Procedures (including critical care time) Labs Review Labs Reviewed - No data to display  Imaging Review No results found.   EKG Interpretation None      MDM   Final diagnoses:  Minor head injury without loss of consciousness, initial encounter    I don't appreciate a hematoma on my exam. Patient does have some firm cysts on the scalp that may be confused for hematoma. The daughter confirms that the patient has history of this type of skin problem and has had cysts removed in the past.  At this time the patient is at her baseline. She did not lose consciousness. I do not think we have to do a head CT at this time. Patient's daughter is comfortable with this plan.   Dorie Rank, MD 02/28/15 [phone]

## 2015-03-04 IMAGING — CR DG CHEST 1V PORT
1 series · 1 of 1 positions shown · non-contrast
Comparison: None.

CLINICAL DATA: Altered mental status

EXAM:
PORTABLE CHEST - 1 VIEW

[portable]
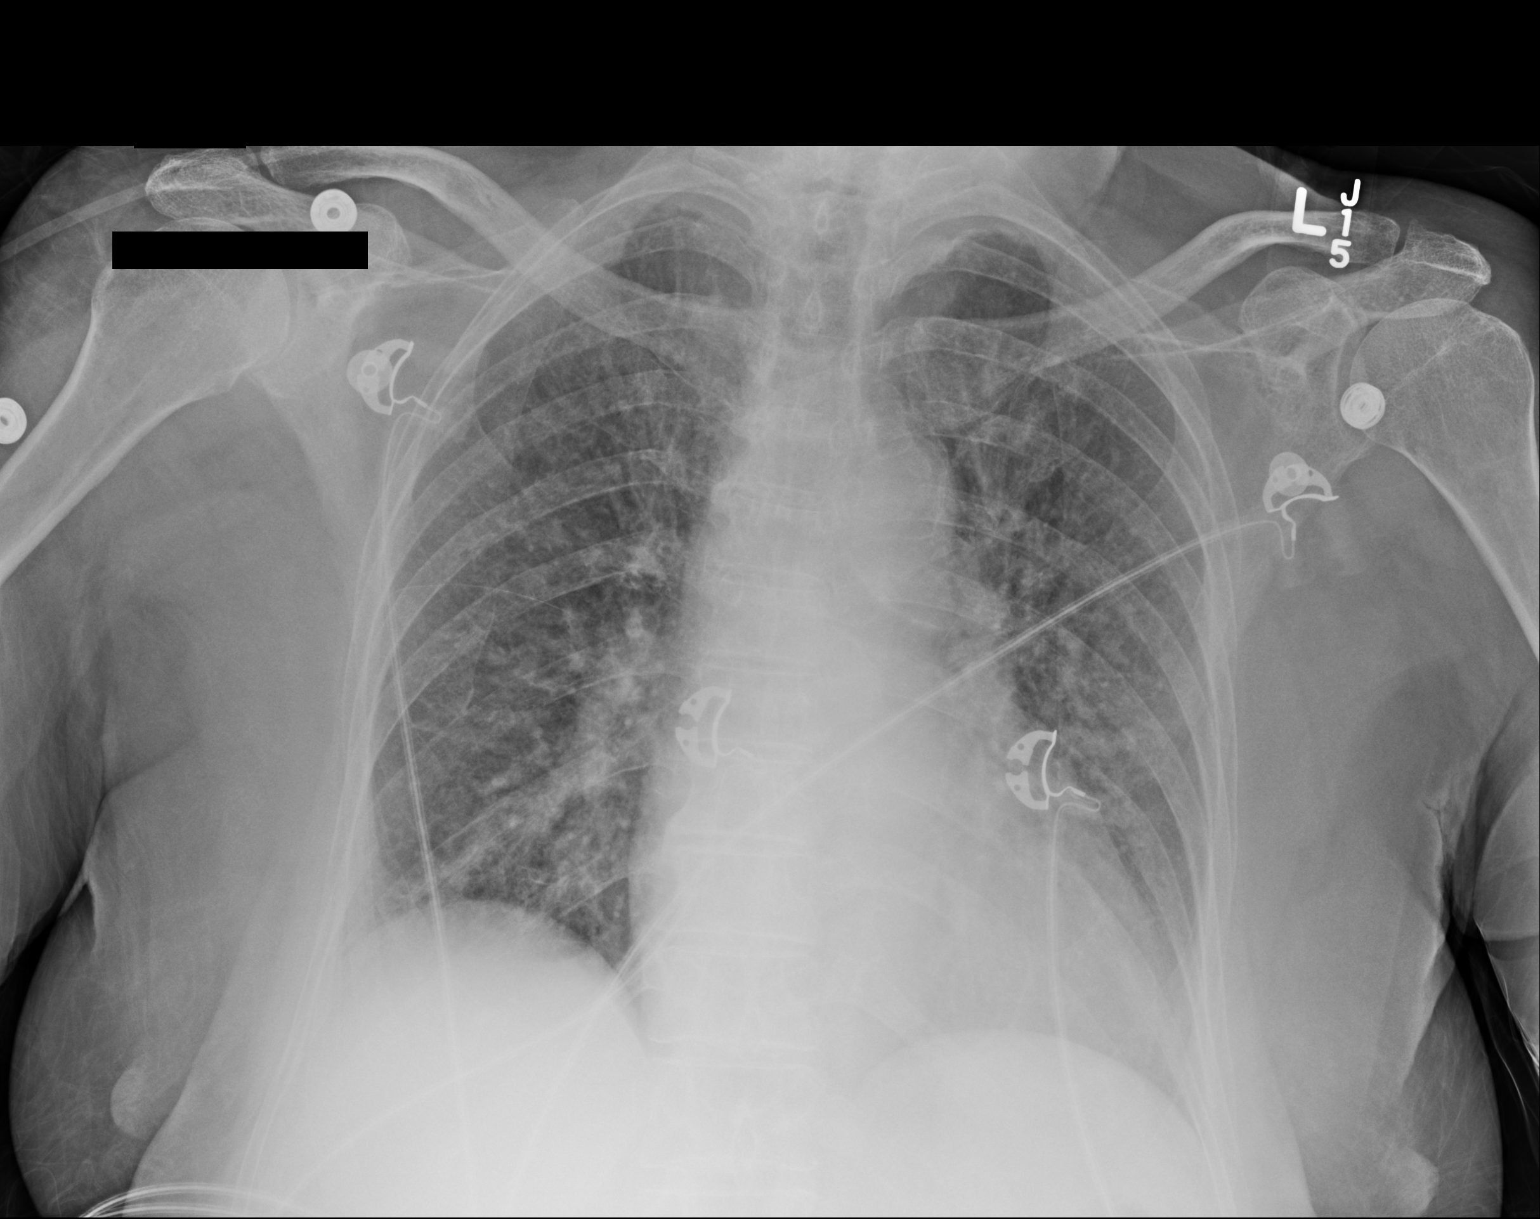

[1 of 1 positions shown; findings below may reference images not displayed]

FINDINGS: The lungs are clear. Heart is upper normal in size with pulmonary
vascularity within normal limits. No adenopathy. No bone lesions.
IMPRESSION: No edema or consolidation.

## 2015-03-26 IMAGING — CT CT HEAD W/O CM
1 series · 15 of 30 positions shown, 19 images · non-contrast
Comparison: 11/26/2014

CLINICAL DATA: Status post fall. Patient is alert. Unsure of loss
of consciousness.

EXAM:
CT HEAD WITHOUT CONTRAST
TECHNIQUE: Contiguous axial images were obtained from the base of the skull
through the vertex without intravenous contrast.

[Series 2: head 5.0 h30s · axial · 0.43mm/px · z∈[-81,+54]mm · 15 of 30 slices shown, 19 images]
[im 2/30  brain]
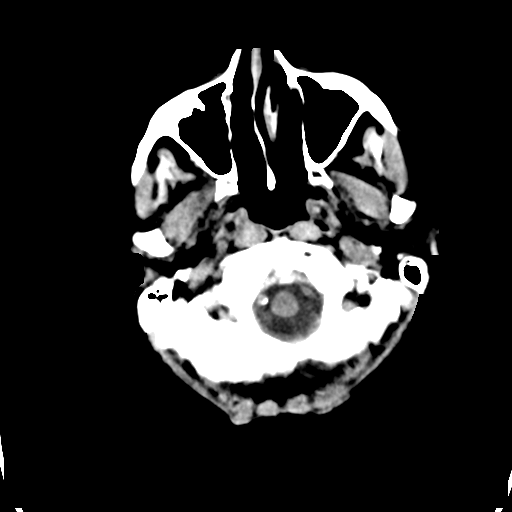
[im 2/30  bone]
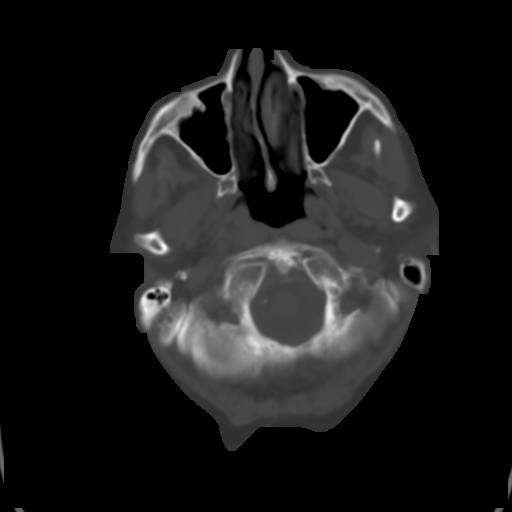
[im 4/30  brain]
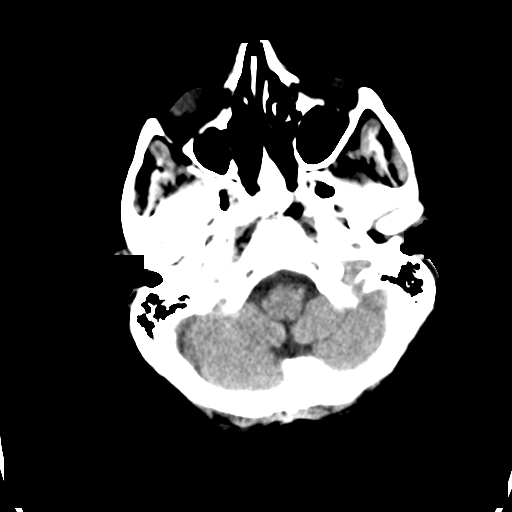
[im 6/30  brain]
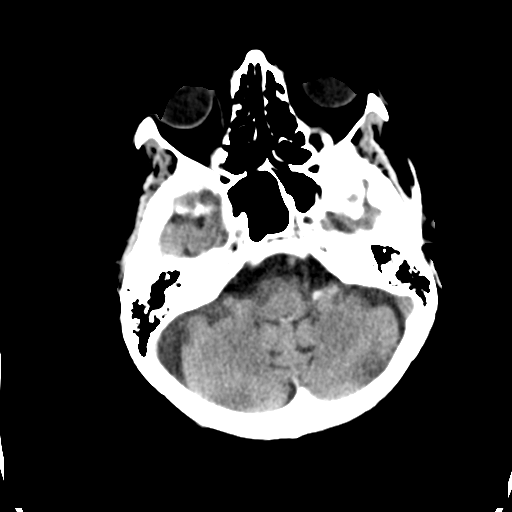
[im 8/30  brain]
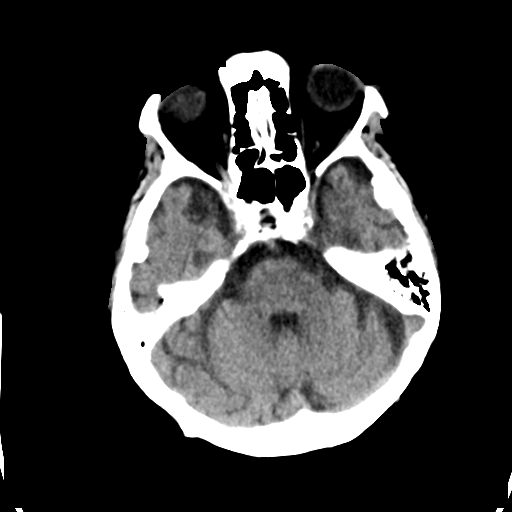
[im 10/30  brain]
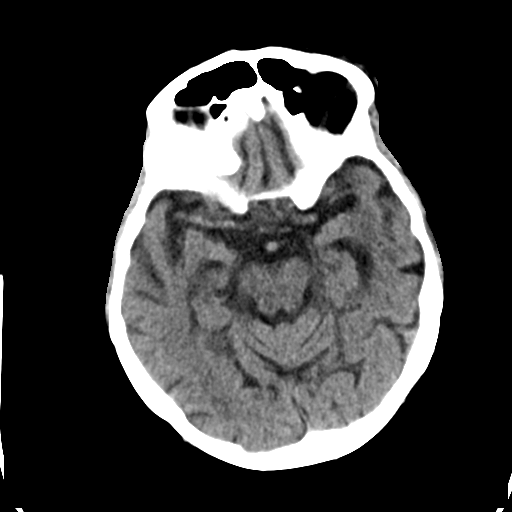
[im 10/30  bone]
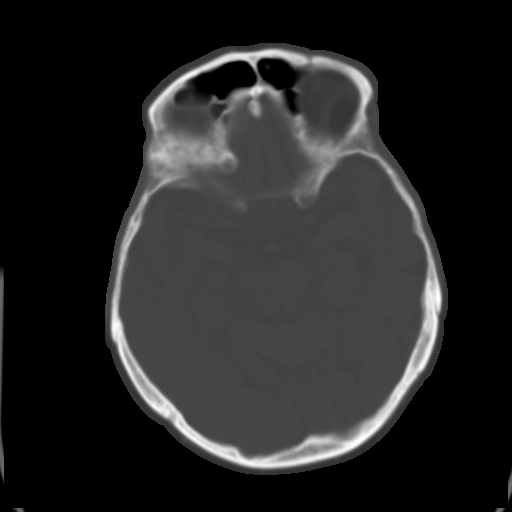
[im 12/30  brain]
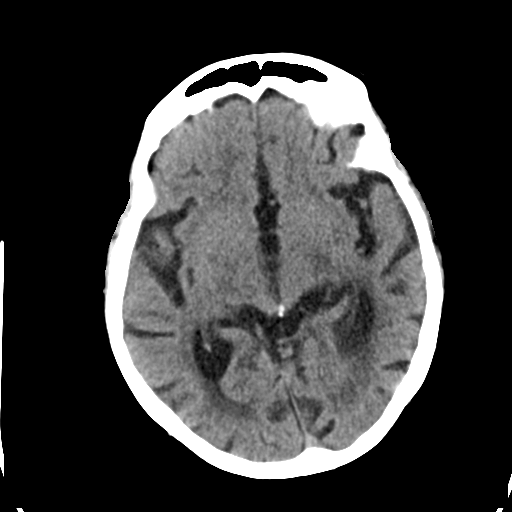
[im 14/30  brain]
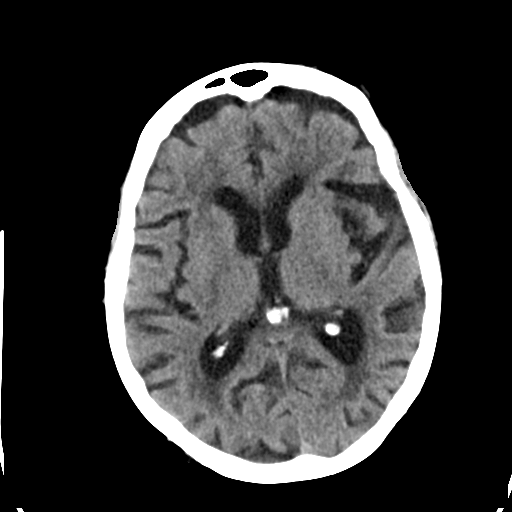
[im 16/30  brain]
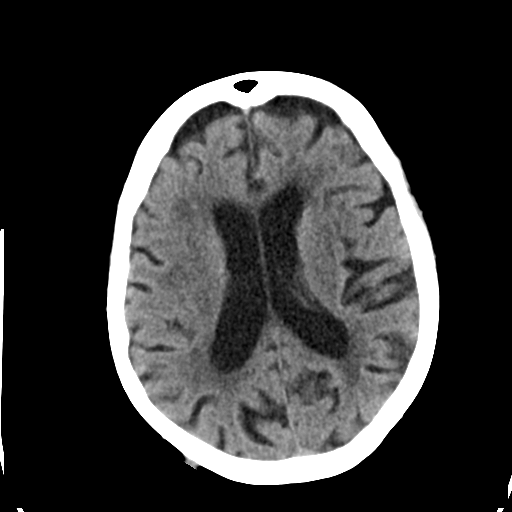
[im 17/30  brain]
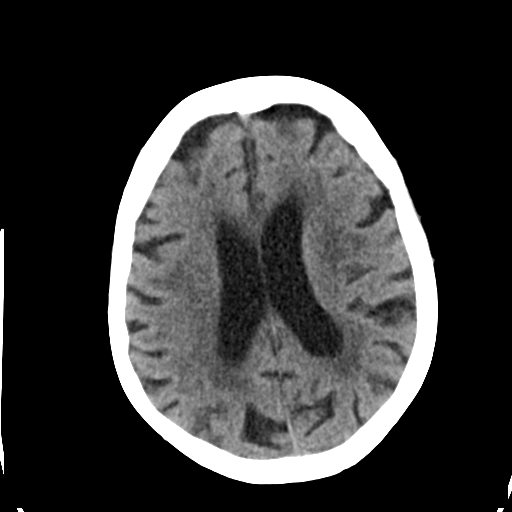
[im 17/30  bone]
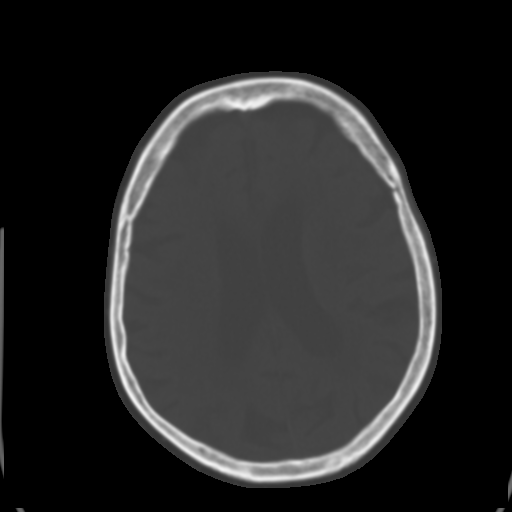
[im 19/30  brain]
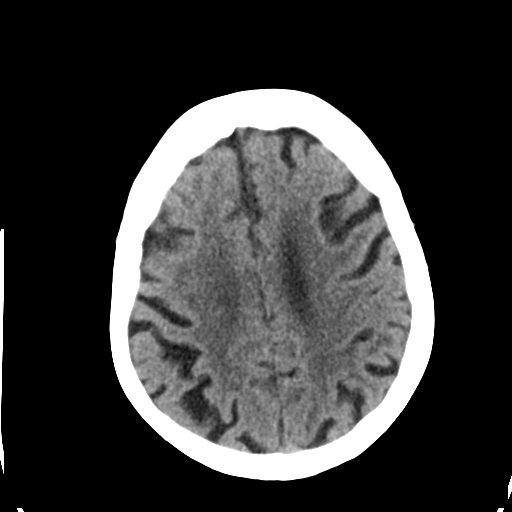
[im 21/30  brain]
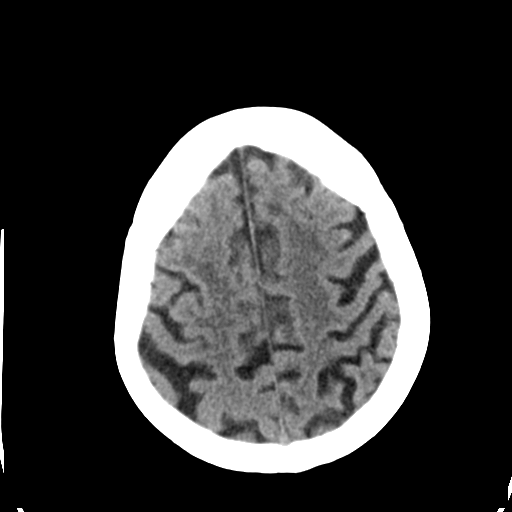
[im 23/30  brain]
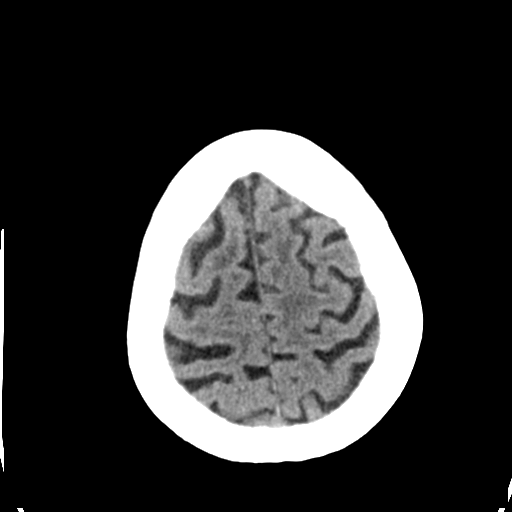
[im 25/30  brain]
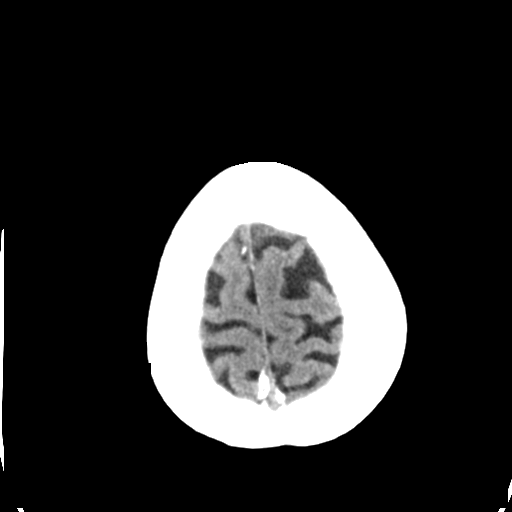
[im 25/30  bone]
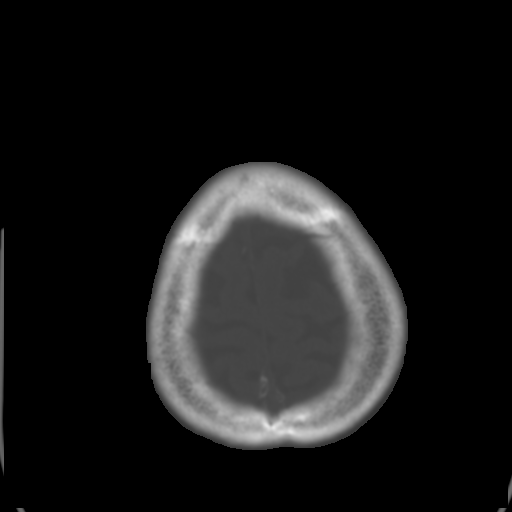
[im 27/30  brain]
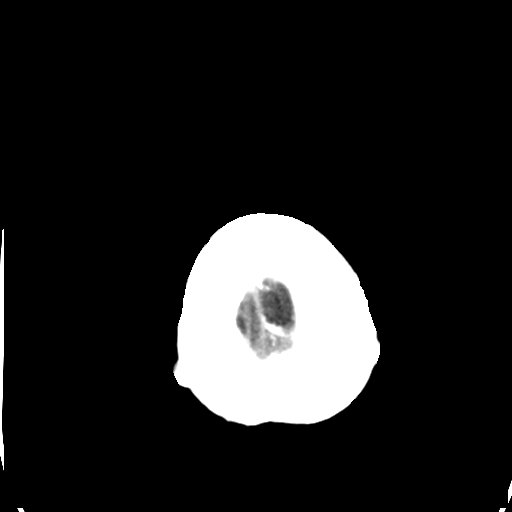
[im 29/30  brain]
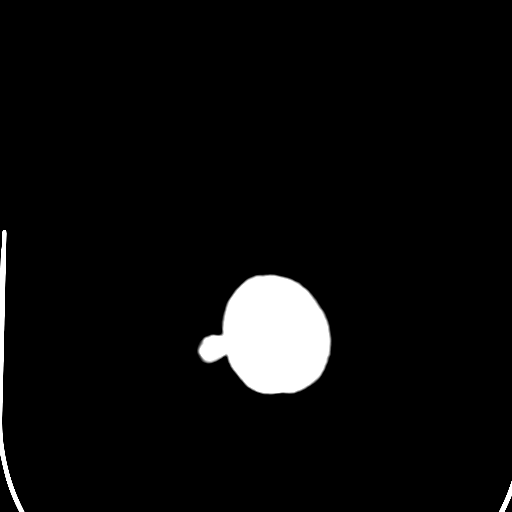

[15 of 30 positions shown; findings below may reference images not displayed]

FINDINGS: There is no evidence of mass effect, midline shift, or extra-axial
fluid collections. There is no evidence of a space-occupying lesion
or intracranial hemorrhage. There is no evidence of a cortical-based
area of acute infarction. There is generalized cerebral atrophy.
There is periventricular white matter low attenuation likely
secondary to microangiopathy.

The ventricles and sulci are appropriate for the patient's age. The
basal cisterns are patent.

Visualized portions of the orbits are unremarkable. The visualized
portions of the paranasal sinuses and mastoid air cells are
unremarkable. Cerebrovascular atherosclerotic calcifications are
noted.

The osseous structures are unremarkable. Multiple scalp sebaceous
cysts.
IMPRESSION: 1. No acute intracranial pathology.
2. Chronic microvascular disease and cerebral atrophy.

## 2015-04-24 ENCOUNTER — Encounter: Payer: Self-pay | Admitting: *Deleted

## 2015-05-02 ENCOUNTER — Emergency Department (HOSPITAL_COMMUNITY): Payer: Medicare Other

## 2015-05-02 ENCOUNTER — Emergency Department (HOSPITAL_COMMUNITY)
Admission: EM | Admit: 2015-05-02 | Discharge: 2015-05-02 | Disposition: A | Discharge status: Home / Self Care | Payer: Medicare Other | Attending: Emergency Medicine | Admitting: Emergency Medicine

## 2015-05-02 ENCOUNTER — Encounter (HOSPITAL_COMMUNITY): Payer: Self-pay | Admitting: Emergency Medicine

## 2015-05-02 DIAGNOSIS — Y939 Activity, unspecified: Secondary | ICD-10-CM | POA: Diagnosis not present

## 2015-05-02 DIAGNOSIS — W19XXXA Unspecified fall, initial encounter: Secondary | ICD-10-CM

## 2015-05-02 DIAGNOSIS — S7002XA Contusion of left hip, initial encounter: Secondary | ICD-10-CM | POA: Insufficient documentation

## 2015-05-02 DIAGNOSIS — F0390 Unspecified dementia without behavioral disturbance: Secondary | ICD-10-CM | POA: Insufficient documentation

## 2015-05-02 DIAGNOSIS — S7012XA Contusion of left thigh, initial encounter: Secondary | ICD-10-CM | POA: Diagnosis not present

## 2015-05-02 DIAGNOSIS — Y999 Unspecified external cause status: Secondary | ICD-10-CM | POA: Insufficient documentation

## 2015-05-02 DIAGNOSIS — Z8669 Personal history of other diseases of the nervous system and sense organs: Secondary | ICD-10-CM | POA: Insufficient documentation

## 2015-05-02 DIAGNOSIS — Z8639 Personal history of other endocrine, nutritional and metabolic disease: Secondary | ICD-10-CM | POA: Insufficient documentation

## 2015-05-02 DIAGNOSIS — Y92129 Unspecified place in nursing home as the place of occurrence of the external cause: Secondary | ICD-10-CM | POA: Diagnosis not present

## 2015-05-02 DIAGNOSIS — Z79899 Other long term (current) drug therapy: Secondary | ICD-10-CM | POA: Diagnosis not present

## 2015-05-02 DIAGNOSIS — S299XXA Unspecified injury of thorax, initial encounter: Secondary | ICD-10-CM | POA: Diagnosis not present

## 2015-05-02 DIAGNOSIS — W1839XA Other fall on same level, initial encounter: Secondary | ICD-10-CM | POA: Insufficient documentation

## 2015-05-02 DIAGNOSIS — I10 Essential (primary) hypertension: Secondary | ICD-10-CM | POA: Diagnosis not present

## 2015-05-02 DIAGNOSIS — Z8543 Personal history of malignant neoplasm of ovary: Secondary | ICD-10-CM | POA: Diagnosis not present

## 2015-05-02 DIAGNOSIS — R0789 Other chest pain: Secondary | ICD-10-CM

## 2015-05-02 DIAGNOSIS — S79912A Unspecified injury of left hip, initial encounter: Secondary | ICD-10-CM | POA: Diagnosis present

## 2015-05-02 MED ORDER — ACETAMINOPHEN 500 MG PO TABS: 1000.0000 mg | ORAL_TABLET | Freq: Three times a day (TID) | ORAL | Status: AC | PRN

## 2015-05-02 MED ORDER — ACETAMINOPHEN 325 MG PO TABS
650.0000 mg | ORAL_TABLET | Freq: Once | ORAL | Status: AC
  Administered 2015-05-02: 650 mg via ORAL
  Filled 2015-05-02: qty 2

## 2015-05-02 MED ORDER — LORAZEPAM 2 MG/ML IJ SOLN
1.0000 mg | Freq: Once | INTRAMUSCULAR | Status: AC
  Administered 2015-05-02: 1 mg via INTRAMUSCULAR
  Filled 2015-05-02: qty 1

## 2015-05-02 NOTE — Discharge Instructions (Signed)

## 2015-05-02 NOTE — ED Notes (Signed)
Bed: [facility] Expected date: 05/02/15 Expected time: 2:13 PM Means of arrival: Ambulance Comments: fall

## 2015-05-02 NOTE — ED Notes (Signed)
Pt from [facility] via EMS post fall. Staff reports that pt had unwitnessed fall. They found pt on floor. Pt and EMS report no obvious injury. Pt has hx of dementia and falls. Pt in NAD

## 2015-05-02 NOTE — ED Provider Notes (Addendum)
CSN: [id number]     Arrival date & time 05/02/15  1419 History   First MD Initiated Contact with Patient 05/02/15 1503     Chief Complaint  Patient presents with  . Fall     (Consider location/radiation/quality/duration/timing/severity/associated sxs/prior Treatment) HPI Comments: Pt is a 81 y/o with hx of dementia and recurrent falls since march with 2 falls today.  Pt is supposed to use a walker or wheelchair but does not use either.  After fall when they were trying to move her she appeared to be in pain and she was brought here.  Family states they saw her for breakfast and she was in a great mood.  She is at her baseline now but just seems to be in pain per family.  She has bruising over her right hip and keeps grabbing her chest.  Unwitnessed fall.  No known LOC or head injury.  Patient is a 81 y.o. female presenting with fall. The history is provided by a relative.  Fall This is a recurrent problem. The current episode started 1 to 2 hours ago. The problem occurs constantly. The problem has not changed since onset.Associated symptoms include chest pain. Associated symptoms comments: Right hip pain. The symptoms are aggravated by bending and standing. Nothing relieves the symptoms. She has tried nothing for the symptoms.    Past Medical History  Diagnosis Date  . Cancer   . Dementia   . Hypertension   . Ovarian cancer   . Macular degeneration   . Hyperlipidemia    History reviewed. No pertinent past surgical history. No family history on file. History  Substance Use Topics  . Smoking status: Never Smoker   . Smokeless tobacco: Not on file  . Alcohol Use: No   OB History    Gravida Para Term Preterm AB TAB SAB Ectopic Multiple Living   0 0 0 0 0 0 0 0       Review of Systems  Cardiovascular: Positive for chest pain.  All other systems reviewed and are negative.     Allergies  Review of patient's allergies indicates no known allergies.  Home Medications   Prior  to Admission medications   Medication Sig Start Date End Date Taking? Authorizing Provider  acetaminophen (TYLENOL) 500 MG tablet Take 1,000 mg by mouth 3 (three) times daily.   Yes Historical Provider, MD  Cholecalciferol (VITAMIN D3) 5000 UNITS CAPS Take 5,000 Units by mouth daily.    Yes Historical Provider, MD  guaifenesin (ROBITUSSIN) 100 MG/5ML syrup Take 200 mg by mouth 4 (four) times daily as needed. For cough    Yes Historical Provider, MD  loperamide (IMODIUM A-D) 2 MG tablet Take 2-4 mg by mouth 4 (four) times daily as needed. 2 tablets with first loose stool, then 1 tablet after each consecutive stool up to 4 doses    Yes Historical Provider, MD  metoprolol succinate (TOPROL-XL) 25 MG 24 hr tablet Take 25 mg by mouth daily.     Yes Historical Provider, MD  Multiple Vitamins-Minerals (I-VITE PO) Take 1 tablet by mouth at bedtime.    Yes Historical Provider, MD  Nutritional Supplements (NUTRITIONAL DRINK PO) Take 8 oz by mouth daily.   Yes Historical Provider, MD  Oyster Shell (OYSCO 500 PO) Take 500 mg by mouth daily.    Yes Historical Provider, MD  sulfamethoxazole-trimethoprim (BACTRIM DS,SEPTRA DS) 800-160 MG per tablet Take 2 tablets by mouth 2 (two) times daily.   Yes Historical Provider, MD  vitamin  B-12 (CYANOCOBALAMIN) 1000 MCG tablet Take 1,000 mcg by mouth daily.   Yes Historical Provider, MD   BP 126/63 mmHg  Pulse 80  Temp(Src)   Resp 17  SpO2 98% Physical Exam  Constitutional: She appears well-developed and well-nourished. No distress.  HENT:  Head: Normocephalic and atraumatic.  Mouth/Throat: Oropharynx is clear and moist.  No evidence of head trauma  Eyes: Conjunctivae and EOM are normal. Pupils are equal, round, and reactive to light.  Neck: Normal range of motion. Neck supple. No spinous process tenderness and no muscular tenderness present.  Cardiovascular: Normal rate, regular rhythm and intact distal pulses.   No murmur heard. Pulmonary/Chest: Effort normal  and breath sounds normal. No respiratory distress. She has no wheezes. She has no rales. She exhibits tenderness and bony tenderness. She exhibits no crepitus, no deformity and no swelling.    Abdominal: Soft. She exhibits no distension. There is no tenderness. There is no rebound and no guarding.  Musculoskeletal: Normal range of motion. She exhibits tenderness. She exhibits no edema.       Left elbow: She exhibits normal range of motion and no swelling. Tenderness found. Olecranon process tenderness noted.       Left hip: She exhibits tenderness and bony tenderness.       Legs: Neurological: She is alert.  Non-verbal butmoves all extremities and is able to stand  Skin: Skin is warm and dry. No rash noted. No erythema.  Psychiatric: She has a normal mood and affect. Her behavior is normal.  Nursing note and vitals reviewed.   ED Course  Procedures (including critical care time) Labs Review Labs Reviewed - No data to display  Imaging Review Dg Chest 2 View  05/02/2015   CLINICAL DATA:  Fall and altered mental status.  EXAM: CHEST  2 VIEW  COMPARISON:  04/10/2011 and 12/04/2009  FINDINGS: Mild cardiomegaly noted.  There is no evidence of focal airspace disease, pulmonary edema, suspicious pulmonary nodule/mass, pleural effusion, or pneumothorax. No acute bony abnormalities are identified.  IMPRESSION: Mild cardiomegaly without evidence of active cardiopulmonary disease.   Electronically Signed   By: Margarette Canada M.D.   On: 05/02/2015 15:59   Dg Hip Unilat With Pelvis 2-3 Views Left  05/02/2015   CLINICAL DATA:  Fall.  Found on floor.  EXAM: DG HIP (WITH OR WITHOUT PELVIS) 2-3V LEFT  COMPARISON:  None.  FINDINGS: No acute bony abnormality. Specifically, no fracture, subluxation, or dislocation. Soft tissues are intact.  IMPRESSION: No acute bony abnormality.   Electronically Signed   By: Rolm Baptise M.D.   On: 05/02/2015 15:59     EKG Interpretation   Date/Time:  Saturday May 02 2015  16:42:43 EDT Ventricular Rate:  83 PR Interval:  109 QRS Duration: 93 QT Interval:  391 QTC Calculation: 459 R Axis:   78 Text Interpretation:  Sinus rhythm Short PR interval Minimal ST  depression, lateral leads Artifact No significant change since last  tracing Confirmed by Maryan Rued  MD, Loree Fee ([id number]) on 05/02/2015 5:05:01 PM      MDM   Final diagnoses:  Fall  Contusion, hip and thigh, left, initial encounter  Chest wall pain    Pt with hx of dementia and multiple falls since march with mechanical fall today.  Family states mentally she is at baseline but when attempting to stand after the fall she acts like she is in pain.  She also has been grabbing her chest and acts likethat hurts too.  Pt is  not able to communicate but appears to be in pain when standing but will bear weight.  Also sternal tenderness with palpation. Heart and lungs are clear.  No head injury and pt does not take anticoagulation.  CXR, hip films pending.  Pt given tylenol.  EKG pending.  5:05 PM Imaging the gated and EKG without acute findings. Patient improved after Tylenol and Ativan. Discharged home with daughter  Blanchie Dessert, MD 05/02/15 Aline, MD 05/02/15 [phone]

## 2015-05-02 NOTE — ED Notes (Signed)
Unable to get patient EKG at this time patient is very agitated at this time.  Nurse has been made aware.

## 2015-08-31 ENCOUNTER — Emergency Department (HOSPITAL_COMMUNITY)
Admission: EM | Admit: 2015-08-31 | Discharge: 2015-09-03 | Disposition: E | Payer: Medicare Other | Attending: Emergency Medicine | Admitting: Emergency Medicine

## 2015-08-31 ENCOUNTER — Encounter (HOSPITAL_COMMUNITY): Payer: Self-pay | Admitting: *Deleted

## 2015-08-31 DIAGNOSIS — E785 Hyperlipidemia, unspecified: Secondary | ICD-10-CM | POA: Insufficient documentation

## 2015-08-31 DIAGNOSIS — I10 Essential (primary) hypertension: Secondary | ICD-10-CM | POA: Diagnosis not present

## 2015-08-31 DIAGNOSIS — I469 Cardiac arrest, cause unspecified: Secondary | ICD-10-CM | POA: Diagnosis not present

## 2015-08-31 DIAGNOSIS — Z792 Long term (current) use of antibiotics: Secondary | ICD-10-CM | POA: Diagnosis not present

## 2015-08-31 DIAGNOSIS — F0390 Unspecified dementia without behavioral disturbance: Secondary | ICD-10-CM | POA: Diagnosis not present

## 2015-08-31 DIAGNOSIS — Z8669 Personal history of other diseases of the nervous system and sense organs: Secondary | ICD-10-CM | POA: Insufficient documentation

## 2015-08-31 DIAGNOSIS — Z8543 Personal history of malignant neoplasm of ovary: Secondary | ICD-10-CM | POA: Insufficient documentation

## 2015-08-31 DIAGNOSIS — Z79899 Other long term (current) drug therapy: Secondary | ICD-10-CM | POA: Insufficient documentation

## 2015-08-31 MED ORDER — MORPHINE SULFATE (PF) 2 MG/ML IV SOLN
2.0000 mg | Freq: Once | INTRAVENOUS | Status: DC
  Filled 2015-08-31: qty 1

## 2015-09-03 NOTE — ED Notes (Signed)
[facility] referral # [phone]. Spoke to [employer].

## 2015-09-03 NOTE — ED Notes (Signed)
Family on phone w/ MD. Does not want anything to be done. [name] who the MD was speaking to.

## 2015-09-03 NOTE — ED Notes (Signed)
TIme of Death 67.

## 2015-09-03 NOTE — ED Notes (Signed)
Pt arrived by [employer]. CPR for 15 min, 1 shock & 3 epi.

## 2015-09-03 NOTE — ED Provider Notes (Addendum)
CSN: [id number]     Arrival date & time 04-Sep-2015  0402 History   First MD Initiated Contact with Patient 2015/09/04 0416     Chief Complaint  Patient presents with  . Cardiac Arrest     (Consider location/radiation/quality/duration/timing/severity/associated sxs/prior Treatment) HPI  Level V caveat  This is an 81 year old female with history of dementia, hypertension, ovarian cancer who presents per EMS status post cardiac arrest. Per EMS, they arrived to a cardiac arrest. Patient lives at [facility] in [place].  Initial rhythm unknown. Per report, ROSC was achieved following CPR facility. Upon EMS arrival, patient lost pulses again and was given 2 epinephrines with return of spontaneous circulation.  She was subsequently intubated and during transport lost pulses again. She was given epinephrine.  She was in a ventricular fibrillation rhythm at that time and was shocked.  Last blood pressure 99 systolic. Upon arrival, patient intubated and unresponsive. Unable to obtain a pressure.  Initial pulse check with no pulse.   During initial evaluation, I spoke with the patient's daughter who is her next of kin on the phone, and the [place]. Daughter requesting no further intervention and DO NOT RESUSCITATE. She requests that her mother be allowed to "pass peacefully."  Given patient history and poor neurologic exam on arrival including dilated pupils and no spontaneous respiratory efforts, I feel this is consistent with the patient's ultimate prognosis.  Past Medical History  Diagnosis Date  . Cancer ([place])   . Dementia   . Hypertension   . Ovarian cancer ([place])   . Macular degeneration   . Hyperlipidemia    History reviewed. No pertinent past surgical history. No family history on file. Social History  Substance Use Topics  . Smoking status: Never Smoker   . Smokeless tobacco: None  . Alcohol Use: No   OB History    Gravida Para Term Preterm AB TAB SAB Ectopic Multiple Living   0 0 0 0  0 0 0 0       Review of Systems  Unable to perform ROS: Acuity of condition      Allergies  Review of patient's allergies indicates no known allergies.  Home Medications   Prior to Admission medications   Medication Sig Start Date End Date Taking? Authorizing Provider  acetaminophen (TYLENOL) 500 MG tablet Take 2 tablets (1,000 mg total) by mouth every 8 (eight) hours as needed for moderate pain. 05/02/15   Blanchie Dessert, MD  Cholecalciferol (VITAMIN D3) 5000 UNITS CAPS Take 5,000 Units by mouth daily.     Historical Provider, MD  guaifenesin (ROBITUSSIN) 100 MG/5ML syrup Take 200 mg by mouth 4 (four) times daily as needed. For cough     Historical Provider, MD  loperamide (IMODIUM A-D) 2 MG tablet Take 2-4 mg by mouth 4 (four) times daily as needed. 2 tablets with first loose stool, then 1 tablet after each consecutive stool up to 4 doses     Historical Provider, MD  metoprolol succinate (TOPROL-XL) 25 MG 24 hr tablet Take 25 mg by mouth daily.      Historical Provider, MD  Multiple Vitamins-Minerals (I-VITE PO) Take 1 tablet by mouth at bedtime.     Historical Provider, MD  Nutritional Supplements (NUTRITIONAL DRINK PO) Take 8 oz by mouth daily.    Historical Provider, MD  Oyster Shell (OYSCO 500 PO) Take 500 mg by mouth daily.     Historical Provider, MD  sulfamethoxazole-trimethoprim (BACTRIM DS,SEPTRA DS) 800-160 MG per tablet Take 2 tablets by  mouth 2 (two) times daily.    Historical Provider, MD  vitamin B-12 (CYANOCOBALAMIN) 1000 MCG tablet Take 1,000 mcg by mouth daily.    Historical Provider, MD   BP 0/0 mmHg  Pulse 0 Physical Exam  Constitutional:  Intubated, GCS 3  HENT:  Head: Normocephalic and atraumatic.  Eyes:  Pupils 8 mm and fixed bilaterally  Cardiovascular:  Pulseless  Pulmonary/Chest:  Intubated, mechanically ventilated  Abdominal: Soft. There is no tenderness.  Musculoskeletal: She exhibits no edema.  Neurological:  GCS 3  Nursing note and vitals  reviewed.   ED Course  Procedures (including critical care time)  CRITICAL CARE Performed by: Merryl Hacker   Total critical care time: 35 minutes  Critical care time was exclusive of separately billable procedures and treating other patients.  Critical care was necessary to treat or prevent imminent or life-threatening deterioration.  Critical care was time spent personally by me on the following activities: development of treatment plan with patient and/or surrogate as well as nursing, discussions with consultants, evaluation of patient's response to treatment, examination of patient, obtaining history from patient or surrogate, ordering and performing treatments and interventions, ordering and review of laboratory studies, ordering and review of radiographic studies, pulse oximetry and re-evaluation of patient's condition.  Labs Review Labs Reviewed - No data to display  Imaging Review No results found. I have personally reviewed and evaluated these images and lab results as part of my medical decision-making.   EKG Interpretation   Date/Time:  09/29/15 04:15:11 EST Ventricular Rate:  61 PR Interval:    QRS Duration: 154 QT Interval:  527 QTC Calculation: 531 R Axis:   -125 Text Interpretation:  Atrial fibrillation Right bundle branch block  Inferior infarct, old Anteroseptal infarct, acute Confirmed by HORTON  MD,  COURTNEY ([id number]) on 29-Sep-2015 4:59:58 AM      EKG Interpretation  Date/Time:  Monday 09-29-15 04:21:42 EST Ventricular Rate:  0 PR Interval:    QRS Duration: 154 QT Interval:  527 QTC Calculation: 531 R Axis:   0 Text Interpretation:  Aystole Confirmed by Dina Rich  MD, COURTNEY ([id number]) on 09/29/2015 5:00:41 AM        MDM   Final diagnoses:  Cardiac arrest ([place])   Patient presents status post arrest. She has lost pulses multiple times and has responded to epinephrine. Daughter is on the phone requesting no further  intervention. Feel this is appropriate and consistent with the patient's ultimate prognosis. On recheck, patient is pulseless. Bedside ultrasound does show minimal coordinated squeeze of the heart. Patient with pulseless electrical activity on the monitor. Asystole at 4:20 AM was confirmed with bedside ultrasound showing no ventricular squeeze. Time of death 4:20 AM. Daughter was updated at the bedside.    INitial EKG with what appears to be ST depression and ischemic changes.   Merryl Hacker, MD 09-29-2015 0500  I will sign death certificate.  Cardiac arrest likely secondary to coronary ischemia/MI.  Merryl Hacker, MD 09-29-2015 [phone]

## 2015-09-03 DEATH — deceased
